# Patient Record
Sex: Female | Born: 1970
Health system: Southern US, Community
[De-identification: ages and names within clinical notes are randomized; demographics above are authoritative.]

## PROBLEM LIST (undated history)

## (undated) DIAGNOSIS — I10 Essential (primary) hypertension: Secondary | ICD-10-CM

## (undated) DIAGNOSIS — R51 Headache: Secondary | ICD-10-CM

## (undated) DIAGNOSIS — M1990 Unspecified osteoarthritis, unspecified site: Secondary | ICD-10-CM

## (undated) DIAGNOSIS — K219 Gastro-esophageal reflux disease without esophagitis: Secondary | ICD-10-CM

## (undated) DIAGNOSIS — F32A Depression, unspecified: Secondary | ICD-10-CM

## (undated) DIAGNOSIS — F419 Anxiety disorder, unspecified: Secondary | ICD-10-CM

## (undated) DIAGNOSIS — G4730 Sleep apnea, unspecified: Secondary | ICD-10-CM

## (undated) DIAGNOSIS — Z1509 Genetic susceptibility to other malignant neoplasm: Secondary | ICD-10-CM

## (undated) DIAGNOSIS — D869 Sarcoidosis, unspecified: Secondary | ICD-10-CM

## (undated) DIAGNOSIS — G709 Myoneural disorder, unspecified: Secondary | ICD-10-CM

## (undated) DIAGNOSIS — C50919 Malignant neoplasm of unspecified site of unspecified female breast: Secondary | ICD-10-CM

## (undated) DIAGNOSIS — R0602 Shortness of breath: Secondary | ICD-10-CM

## (undated) DIAGNOSIS — Z1501 Genetic susceptibility to malignant neoplasm of breast: Secondary | ICD-10-CM

## (undated) DIAGNOSIS — F329 Major depressive disorder, single episode, unspecified: Secondary | ICD-10-CM

## (undated) DIAGNOSIS — Z9109 Other allergy status, other than to drugs and biological substances: Secondary | ICD-10-CM

## (undated) DIAGNOSIS — J189 Pneumonia, unspecified organism: Secondary | ICD-10-CM

## (undated) DIAGNOSIS — J45909 Unspecified asthma, uncomplicated: Secondary | ICD-10-CM

## (undated) HISTORY — DX: Unspecified osteoarthritis, unspecified site: M19.90

## (undated) HISTORY — PX: TUBAL LIGATION: SHX77

## (undated) HISTORY — DX: Major depressive disorder, single episode, unspecified: F32.9

## (undated) HISTORY — PX: BREAST SURGERY: SHX581

## (undated) HISTORY — PX: MASTECTOMY: SHX3

## (undated) HISTORY — DX: Genetic susceptibility to malignant neoplasm of breast: Z15.01

## (undated) HISTORY — PX: ABDOMINAL HYSTERECTOMY: SHX81

## (undated) HISTORY — DX: Depression, unspecified: F32.A

## (undated) HISTORY — DX: Malignant neoplasm of unspecified site of unspecified female breast: C50.919

## (undated) HISTORY — DX: Genetic susceptibility to other malignant neoplasm: Z15.09

## (undated) HISTORY — DX: Anxiety disorder, unspecified: F41.9

---

## 2005-10-15 ENCOUNTER — Emergency Department (HOSPITAL_COMMUNITY): Admission: EM | Admit: 2005-10-15 | Discharge: 2005-10-15 | Payer: Self-pay | Admitting: Emergency Medicine

## 2007-09-19 DIAGNOSIS — C50919 Malignant neoplasm of unspecified site of unspecified female breast: Secondary | ICD-10-CM

## 2007-09-19 HISTORY — PX: OTHER SURGICAL HISTORY: SHX169

## 2007-09-19 HISTORY — DX: Malignant neoplasm of unspecified site of unspecified female breast: C50.919

## 2007-11-28 DIAGNOSIS — C50919 Malignant neoplasm of unspecified site of unspecified female breast: Secondary | ICD-10-CM | POA: Insufficient documentation

## 2007-12-16 ENCOUNTER — Ambulatory Visit: Payer: Self-pay | Admitting: Oncology

## 2007-12-17 ENCOUNTER — Encounter: Admission: RE | Admit: 2007-12-17 | Discharge: 2007-12-17 | Payer: Self-pay | Admitting: General Surgery

## 2008-01-01 ENCOUNTER — Ambulatory Visit (HOSPITAL_COMMUNITY): Admission: RE | Admit: 2008-01-01 | Discharge: 2008-01-01 | Payer: Self-pay | Admitting: Oncology

## 2008-09-15 ENCOUNTER — Ambulatory Visit: Admission: RE | Admit: 2008-09-15 | Discharge: 2008-12-14 | Payer: Self-pay | Admitting: Radiation Oncology

## 2008-09-16 ENCOUNTER — Ambulatory Visit: Payer: Self-pay | Admitting: Oncology

## 2008-09-17 ENCOUNTER — Ambulatory Visit: Admission: RE | Admit: 2008-09-17 | Discharge: 2008-09-17 | Payer: Self-pay | Admitting: Radiation Oncology

## 2008-09-17 ENCOUNTER — Encounter: Payer: Self-pay | Admitting: Radiation Oncology

## 2008-09-18 HISTORY — PX: COLONOSCOPY: SHX174

## 2008-09-30 LAB — CBC & DIFF AND RETIC
BASO%: 0.8 % (ref 0.0–2.0)
Basophils Absolute: 0 10*3/uL (ref 0.0–0.1)
EOS%: 0.8 % (ref 0.0–7.0)
Eosinophils Absolute: 0 10*3/uL (ref 0.0–0.5)
HCT: 32.7 % — ABNORMAL LOW (ref 34.8–46.6)
HGB: 10.4 g/dL — ABNORMAL LOW (ref 11.6–15.9)
IRF: 0.34 — ABNORMAL HIGH (ref 0.130–0.330)
LYMPH%: 30.8 % (ref 14.0–48.0)
MCH: 25.3 pg — ABNORMAL LOW (ref 26.0–34.0)
MCHC: 31.8 g/dL — ABNORMAL LOW (ref 32.0–36.0)
MCV: 79.6 fL — ABNORMAL LOW (ref 81.0–101.0)
MONO#: 0.4 10*3/uL (ref 0.1–0.9)
MONO%: 8 % (ref 0.0–13.0)
NEUT#: 3.3 10*3/uL (ref 1.5–6.5)
NEUT%: 59.6 % (ref 39.6–76.8)
Platelets: 391 10*3/uL (ref 145–400)
RBC: 4.11 10*6/uL (ref 3.70–5.32)
RDW: 18.1 % — ABNORMAL HIGH (ref 11.3–14.5)
RETIC #: 77.3 10*3/uL (ref 19.7–115.1)
Retic %: 1.9 % (ref 0.4–2.3)
WBC: 5.6 10*3/uL (ref 3.9–10.0)
lymph#: 1.7 10*3/uL (ref 0.9–3.3)

## 2008-09-30 LAB — MORPHOLOGY: PLT EST: ADEQUATE

## 2008-09-30 LAB — IRON AND TIBC
%SAT: 44 % (ref 20–55)
Iron: 221 ug/dL — ABNORMAL HIGH (ref 42–145)
TIBC: 503 ug/dL — ABNORMAL HIGH (ref 250–470)
UIBC: 282 ug/dL

## 2008-09-30 LAB — COMPREHENSIVE METABOLIC PANEL
ALT: 12 U/L (ref 0–35)
AST: 14 U/L (ref 0–37)
Albumin: 4.2 g/dL (ref 3.5–5.2)
Alkaline Phosphatase: 59 U/L (ref 39–117)
BUN: 7 mg/dL (ref 6–23)
CO2: 24 mEq/L (ref 19–32)
Calcium: 9.5 mg/dL (ref 8.4–10.5)
Chloride: 104 mEq/L (ref 96–112)
Creatinine, Ser: 0.71 mg/dL (ref 0.40–1.20)
Glucose, Bld: 80 mg/dL (ref 70–99)
Potassium: 3.9 mEq/L (ref 3.5–5.3)
Sodium: 138 mEq/L (ref 135–145)
Total Bilirubin: 0.3 mg/dL (ref 0.3–1.2)
Total Protein: 7.5 g/dL (ref 6.0–8.3)

## 2008-09-30 LAB — CHCC SMEAR

## 2008-09-30 LAB — CANCER ANTIGEN 27.29: CA 27.29: 8 U/mL (ref 0–39)

## 2008-09-30 LAB — LACTATE DEHYDROGENASE: LDH: 135 U/L (ref 94–250)

## 2008-09-30 LAB — FERRITIN: Ferritin: 6 ng/mL — ABNORMAL LOW (ref 10–291)

## 2008-10-02 LAB — CELL SEARCH FOR BREAST CANCER

## 2008-10-27 LAB — CBC WITH DIFFERENTIAL/PLATELET
BASO%: 0.5 % (ref 0.0–2.0)
Basophils Absolute: 0 10*3/uL (ref 0.0–0.1)
EOS%: 1.1 % (ref 0.0–7.0)
Eosinophils Absolute: 0.1 10*3/uL (ref 0.0–0.5)
HCT: 34.7 % — ABNORMAL LOW (ref 34.8–46.6)
HGB: 11.4 g/dL — ABNORMAL LOW (ref 11.6–15.9)
LYMPH%: 13.7 % — ABNORMAL LOW (ref 14.0–48.0)
MCH: 26.3 pg (ref 26.0–34.0)
MCHC: 32.9 g/dL (ref 32.0–36.0)
MCV: 79.9 fL — ABNORMAL LOW (ref 81.0–101.0)
MONO#: 0.6 10*3/uL (ref 0.1–0.9)
MONO%: 10.7 % (ref 0.0–13.0)
NEUT#: 4.2 10*3/uL (ref 1.5–6.5)
NEUT%: 74 % (ref 39.6–76.8)
Platelets: 257 10*3/uL (ref 145–400)
RBC: 4.34 10*6/uL (ref 3.70–5.32)
RDW: 20.1 % — ABNORMAL HIGH (ref 11.3–14.5)
WBC: 5.6 10*3/uL (ref 3.9–10.0)
lymph#: 0.8 10*3/uL — ABNORMAL LOW (ref 0.9–3.3)

## 2008-10-30 ENCOUNTER — Ambulatory Visit: Payer: Self-pay | Admitting: Oncology

## 2008-11-09 ENCOUNTER — Ambulatory Visit: Payer: Self-pay | Admitting: Psychiatry

## 2008-11-17 LAB — CBC WITH DIFFERENTIAL/PLATELET
BASO%: 0.3 % (ref 0.0–2.0)
Basophils Absolute: 0 10*3/uL (ref 0.0–0.1)
EOS%: 1.6 % (ref 0.0–7.0)
Eosinophils Absolute: 0.1 10*3/uL (ref 0.0–0.5)
HCT: 36.6 % (ref 34.8–46.6)
HGB: 12.1 g/dL (ref 11.6–15.9)
LYMPH%: 11.7 % — ABNORMAL LOW (ref 14.0–49.7)
MCH: 26.7 pg (ref 25.1–34.0)
MCHC: 33 g/dL (ref 31.5–36.0)
MCV: 81.1 fL (ref 79.5–101.0)
MONO#: 0.6 10*3/uL (ref 0.1–0.9)
MONO%: 11.5 % (ref 0.0–14.0)
NEUT#: 3.8 10*3/uL (ref 1.5–6.5)
NEUT%: 74.9 % (ref 38.4–76.8)
Platelets: 360 10*3/uL (ref 145–400)
RBC: 4.51 10*6/uL (ref 3.70–5.45)
RDW: 22 % — ABNORMAL HIGH (ref 11.2–14.5)
WBC: 5.1 10*3/uL (ref 3.9–10.3)
lymph#: 0.6 10*3/uL — ABNORMAL LOW (ref 0.9–3.3)

## 2008-11-19 LAB — CBC WITH DIFFERENTIAL/PLATELET
BASO%: 0.3 % (ref 0.0–2.0)
Basophils Absolute: 0 10*3/uL (ref 0.0–0.1)
EOS%: 1.5 % (ref 0.0–7.0)
Eosinophils Absolute: 0.1 10*3/uL (ref 0.0–0.5)
HCT: 35 % (ref 34.8–46.6)
HGB: 11.7 g/dL (ref 11.6–15.9)
LYMPH%: 12.1 % — ABNORMAL LOW (ref 14.0–49.7)
MCH: 26.9 pg (ref 25.1–34.0)
MCHC: 33.4 g/dL (ref 31.5–36.0)
MCV: 80.4 fL (ref 79.5–101.0)
MONO#: 0.6 10*3/uL (ref 0.1–0.9)
MONO%: 13.4 % (ref 0.0–14.0)
NEUT#: 3.3 10*3/uL (ref 1.5–6.5)
NEUT%: 72.7 % (ref 38.4–76.8)
Platelets: 348 10*3/uL (ref 145–400)
RBC: 4.35 10*6/uL (ref 3.70–5.45)
RDW: 22.2 % — ABNORMAL HIGH (ref 11.2–14.5)
WBC: 4.6 10*3/uL (ref 3.9–10.3)
lymph#: 0.6 10*3/uL — ABNORMAL LOW (ref 0.9–3.3)

## 2008-11-19 LAB — COMPREHENSIVE METABOLIC PANEL
ALT: 17 U/L (ref 0–35)
AST: 15 U/L (ref 0–37)
Albumin: 4 g/dL (ref 3.5–5.2)
Alkaline Phosphatase: 52 U/L (ref 39–117)
BUN: 6 mg/dL (ref 6–23)
CO2: 23 mEq/L (ref 19–32)
Calcium: 9.3 mg/dL (ref 8.4–10.5)
Chloride: 105 mEq/L (ref 96–112)
Creatinine, Ser: 0.61 mg/dL (ref 0.40–1.20)
Glucose, Bld: 94 mg/dL (ref 70–99)
Potassium: 4.1 mEq/L (ref 3.5–5.3)
Sodium: 138 mEq/L (ref 135–145)
Total Bilirubin: 0.2 mg/dL — ABNORMAL LOW (ref 0.3–1.2)
Total Protein: 7.4 g/dL (ref 6.0–8.3)

## 2008-11-23 ENCOUNTER — Ambulatory Visit: Payer: Self-pay | Admitting: Psychiatry

## 2008-11-24 LAB — CBC WITH DIFFERENTIAL/PLATELET
BASO%: 0.2 % (ref 0.0–2.0)
Basophils Absolute: 0 10*3/uL (ref 0.0–0.1)
EOS%: 0.9 % (ref 0.0–7.0)
Eosinophils Absolute: 0 10*3/uL (ref 0.0–0.5)
HCT: 33.7 % — ABNORMAL LOW (ref 34.8–46.6)
HGB: 11.2 g/dL — ABNORMAL LOW (ref 11.6–15.9)
LYMPH%: 10.7 % — ABNORMAL LOW (ref 14.0–49.7)
MCH: 26.8 pg (ref 25.1–34.0)
MCHC: 33.3 g/dL (ref 31.5–36.0)
MCV: 80.7 fL (ref 79.5–101.0)
MONO#: 0.7 10*3/uL (ref 0.1–0.9)
MONO%: 12.5 % (ref 0.0–14.0)
NEUT#: 4.1 10*3/uL (ref 1.5–6.5)
NEUT%: 75.7 % (ref 38.4–76.8)
Platelets: 326 10*3/uL (ref 145–400)
RBC: 4.18 10*6/uL (ref 3.70–5.45)
RDW: 22.4 % — ABNORMAL HIGH (ref 11.2–14.5)
WBC: 5.4 10*3/uL (ref 3.9–10.3)
lymph#: 0.6 10*3/uL — ABNORMAL LOW (ref 0.9–3.3)

## 2008-11-24 LAB — COMPREHENSIVE METABOLIC PANEL
ALT: 14 U/L (ref 0–35)
AST: 14 U/L (ref 0–37)
Albumin: 3.8 g/dL (ref 3.5–5.2)
Alkaline Phosphatase: 55 U/L (ref 39–117)
BUN: 7 mg/dL (ref 6–23)
CO2: 23 mEq/L (ref 19–32)
Calcium: 9.5 mg/dL (ref 8.4–10.5)
Chloride: 104 mEq/L (ref 96–112)
Creatinine, Ser: 0.65 mg/dL (ref 0.40–1.20)
Glucose, Bld: 103 mg/dL — ABNORMAL HIGH (ref 70–99)
Potassium: 4.2 mEq/L (ref 3.5–5.3)
Sodium: 138 mEq/L (ref 135–145)
Total Bilirubin: 0.2 mg/dL — ABNORMAL LOW (ref 0.3–1.2)
Total Protein: 6.9 g/dL (ref 6.0–8.3)

## 2008-11-27 ENCOUNTER — Encounter: Admission: RE | Admit: 2008-11-27 | Discharge: 2008-12-16 | Payer: Self-pay | Admitting: Radiation Oncology

## 2008-11-28 ENCOUNTER — Ambulatory Visit (HOSPITAL_COMMUNITY): Admission: RE | Admit: 2008-11-28 | Discharge: 2008-11-28 | Payer: Self-pay | Admitting: Oncology

## 2008-12-08 LAB — CBC WITH DIFFERENTIAL/PLATELET
BASO%: 0.2 % (ref 0.0–2.0)
Basophils Absolute: 0 10*3/uL (ref 0.0–0.1)
EOS%: 1.6 % (ref 0.0–7.0)
Eosinophils Absolute: 0.1 10*3/uL (ref 0.0–0.5)
HCT: 29.9 % — ABNORMAL LOW (ref 34.8–46.6)
HGB: 10.2 g/dL — ABNORMAL LOW (ref 11.6–15.9)
LYMPH%: 18.9 % (ref 14.0–49.7)
MCH: 27.6 pg (ref 25.1–34.0)
MCHC: 34.2 g/dL (ref 31.5–36.0)
MCV: 80.8 fL (ref 79.5–101.0)
MONO#: 0.4 10*3/uL (ref 0.1–0.9)
MONO%: 9.6 % (ref 0.0–14.0)
NEUT#: 3 10*3/uL (ref 1.5–6.5)
NEUT%: 69.7 % (ref 38.4–76.8)
Platelets: 281 10*3/uL (ref 145–400)
RBC: 3.7 10*6/uL (ref 3.70–5.45)
RDW: 23.2 % — ABNORMAL HIGH (ref 11.2–14.5)
WBC: 4.3 10*3/uL (ref 3.9–10.3)
lymph#: 0.8 10*3/uL — ABNORMAL LOW (ref 0.9–3.3)

## 2008-12-08 LAB — COMPREHENSIVE METABOLIC PANEL
ALT: 20 U/L (ref 0–35)
AST: 19 U/L (ref 0–37)
Albumin: 3.8 g/dL (ref 3.5–5.2)
Alkaline Phosphatase: 52 U/L (ref 39–117)
BUN: 7 mg/dL (ref 6–23)
CO2: 22 mEq/L (ref 19–32)
Calcium: 9.4 mg/dL (ref 8.4–10.5)
Chloride: 105 mEq/L (ref 96–112)
Creatinine, Ser: 0.68 mg/dL (ref 0.40–1.20)
Glucose, Bld: 107 mg/dL — ABNORMAL HIGH (ref 70–99)
Potassium: 4 mEq/L (ref 3.5–5.3)
Sodium: 137 mEq/L (ref 135–145)
Total Bilirubin: 0.2 mg/dL — ABNORMAL LOW (ref 0.3–1.2)
Total Protein: 6.9 g/dL (ref 6.0–8.3)

## 2008-12-09 ENCOUNTER — Ambulatory Visit: Payer: Self-pay | Admitting: Psychiatry

## 2008-12-17 ENCOUNTER — Ambulatory Visit: Payer: Self-pay | Admitting: Oncology

## 2008-12-17 ENCOUNTER — Ambulatory Visit: Payer: Self-pay | Admitting: Psychiatry

## 2008-12-17 ENCOUNTER — Encounter (HOSPITAL_COMMUNITY): Admission: RE | Admit: 2008-12-17 | Discharge: 2009-01-21 | Payer: Self-pay | Admitting: Oncology

## 2008-12-17 LAB — CBC WITH DIFFERENTIAL/PLATELET
BASO%: 0.5 % (ref 0.0–2.0)
Basophils Absolute: 0 10*3/uL (ref 0.0–0.1)
EOS%: 1.2 % (ref 0.0–7.0)
Eosinophils Absolute: 0.1 10*3/uL (ref 0.0–0.5)
HCT: 25.5 % — ABNORMAL LOW (ref 34.8–46.6)
HGB: 8.6 g/dL — ABNORMAL LOW (ref 11.6–15.9)
LYMPH%: 20.3 % (ref 14.0–49.7)
MCH: 28.4 pg (ref 25.1–34.0)
MCHC: 33.9 g/dL (ref 31.5–36.0)
MCV: 83.6 fL (ref 79.5–101.0)
MONO#: 0.6 10*3/uL (ref 0.1–0.9)
MONO%: 12.3 % (ref 0.0–14.0)
NEUT#: 3 10*3/uL (ref 1.5–6.5)
NEUT%: 65.7 % (ref 38.4–76.8)
Platelets: 361 10*3/uL (ref 145–400)
RBC: 3.05 10*6/uL — ABNORMAL LOW (ref 3.70–5.45)
RDW: 24 % — ABNORMAL HIGH (ref 11.2–14.5)
WBC: 4.5 10*3/uL (ref 3.9–10.3)
lymph#: 0.9 10*3/uL (ref 0.9–3.3)

## 2008-12-17 LAB — HOLD TUBE, BLOOD BANK

## 2008-12-17 LAB — TYPE & CROSSMATCH - CHCC

## 2008-12-24 ENCOUNTER — Ambulatory Visit: Payer: Self-pay | Admitting: Psychiatry

## 2009-01-07 LAB — COMPREHENSIVE METABOLIC PANEL
ALT: 16 U/L (ref 0–35)
AST: 15 U/L (ref 0–37)
Albumin: 3.8 g/dL (ref 3.5–5.2)
Alkaline Phosphatase: 63 U/L (ref 39–117)
BUN: 8 mg/dL (ref 6–23)
CO2: 22 mEq/L (ref 19–32)
Calcium: 9 mg/dL (ref 8.4–10.5)
Chloride: 107 mEq/L (ref 96–112)
Creatinine, Ser: 0.67 mg/dL (ref 0.40–1.20)
Glucose, Bld: 115 mg/dL — ABNORMAL HIGH (ref 70–99)
Potassium: 4.1 mEq/L (ref 3.5–5.3)
Sodium: 139 mEq/L (ref 135–145)
Total Bilirubin: 0.2 mg/dL — ABNORMAL LOW (ref 0.3–1.2)
Total Protein: 6.9 g/dL (ref 6.0–8.3)

## 2009-01-07 LAB — CBC WITH DIFFERENTIAL/PLATELET
BASO%: 0.6 % (ref 0.0–2.0)
Basophils Absolute: 0 10*3/uL (ref 0.0–0.1)
EOS%: 3.6 % (ref 0.0–7.0)
Eosinophils Absolute: 0.2 10*3/uL (ref 0.0–0.5)
HCT: 31 % — ABNORMAL LOW (ref 34.8–46.6)
HGB: 10.3 g/dL — ABNORMAL LOW (ref 11.6–15.9)
LYMPH%: 17.5 % (ref 14.0–49.7)
MCH: 28.1 pg (ref 25.1–34.0)
MCHC: 33.1 g/dL (ref 31.5–36.0)
MCV: 85.1 fL (ref 79.5–101.0)
MONO#: 0.4 10*3/uL (ref 0.1–0.9)
MONO%: 6.8 % (ref 0.0–14.0)
NEUT#: 4.2 10*3/uL (ref 1.5–6.5)
NEUT%: 71.5 % (ref 38.4–76.8)
Platelets: 347 10*3/uL (ref 145–400)
RBC: 3.65 10*6/uL — ABNORMAL LOW (ref 3.70–5.45)
RDW: 16.7 % — ABNORMAL HIGH (ref 11.2–14.5)
WBC: 5.9 10*3/uL (ref 3.9–10.3)
lymph#: 1 10*3/uL (ref 0.9–3.3)

## 2009-01-07 LAB — CANCER ANTIGEN 27.29: CA 27.29: 8 U/mL (ref 0–39)

## 2009-01-26 ENCOUNTER — Ambulatory Visit: Payer: Self-pay | Admitting: Psychiatry

## 2009-02-03 ENCOUNTER — Ambulatory Visit: Payer: Self-pay | Admitting: Psychiatry

## 2009-03-03 ENCOUNTER — Ambulatory Visit: Payer: Self-pay | Admitting: Oncology

## 2009-03-03 ENCOUNTER — Ambulatory Visit: Payer: Self-pay | Admitting: Psychiatry

## 2009-03-10 ENCOUNTER — Emergency Department (HOSPITAL_COMMUNITY): Admission: EM | Admit: 2009-03-10 | Discharge: 2009-03-10 | Payer: Self-pay | Admitting: Emergency Medicine

## 2009-03-30 ENCOUNTER — Ambulatory Visit: Payer: Self-pay | Admitting: Psychiatry

## 2009-04-01 LAB — CBC WITH DIFFERENTIAL/PLATELET
BASO%: 0.4 % (ref 0.0–2.0)
Basophils Absolute: 0 10*3/uL (ref 0.0–0.1)
EOS%: 2.3 % (ref 0.0–7.0)
Eosinophils Absolute: 0.1 10*3/uL (ref 0.0–0.5)
HCT: 37.6 % (ref 34.8–46.6)
HGB: 12.5 g/dL (ref 11.6–15.9)
LYMPH%: 28 % (ref 14.0–49.7)
MCH: 27.2 pg (ref 25.1–34.0)
MCHC: 33.2 g/dL (ref 31.5–36.0)
MCV: 81.9 fL (ref 79.5–101.0)
MONO#: 0.5 10*3/uL (ref 0.1–0.9)
MONO%: 9.2 % (ref 0.0–14.0)
NEUT#: 3.2 10*3/uL (ref 1.5–6.5)
NEUT%: 60.1 % (ref 38.4–76.8)
Platelets: 300 10*3/uL (ref 145–400)
RBC: 4.59 10*6/uL (ref 3.70–5.45)
RDW: 16 % — ABNORMAL HIGH (ref 11.2–14.5)
WBC: 5.4 10*3/uL (ref 3.9–10.3)
lymph#: 1.5 10*3/uL (ref 0.9–3.3)

## 2009-04-01 LAB — COMPREHENSIVE METABOLIC PANEL
ALT: 23 U/L (ref 0–35)
AST: 18 U/L (ref 0–37)
Albumin: 4 g/dL (ref 3.5–5.2)
Alkaline Phosphatase: 59 U/L (ref 39–117)
BUN: 8 mg/dL (ref 6–23)
CO2: 25 mEq/L (ref 19–32)
Calcium: 10 mg/dL (ref 8.4–10.5)
Chloride: 105 mEq/L (ref 96–112)
Creatinine, Ser: 0.69 mg/dL (ref 0.40–1.20)
Glucose, Bld: 135 mg/dL — ABNORMAL HIGH (ref 70–99)
Potassium: 4.3 mEq/L (ref 3.5–5.3)
Sodium: 140 mEq/L (ref 135–145)
Total Bilirubin: 0.3 mg/dL (ref 0.3–1.2)
Total Protein: 7.1 g/dL (ref 6.0–8.3)

## 2009-04-01 LAB — CANCER ANTIGEN 27.29: CA 27.29: 15 U/mL (ref 0–39)

## 2009-04-01 LAB — VITAMIN D 25 HYDROXY (VIT D DEFICIENCY, FRACTURES): Vit D, 25-Hydroxy: 31 ng/mL (ref 30–89)

## 2009-04-01 LAB — LACTATE DEHYDROGENASE: LDH: 147 U/L (ref 94–250)

## 2009-04-06 ENCOUNTER — Ambulatory Visit: Payer: Self-pay | Admitting: Oncology

## 2009-05-17 ENCOUNTER — Ambulatory Visit: Payer: Self-pay | Admitting: Psychiatry

## 2009-07-15 ENCOUNTER — Ambulatory Visit: Payer: Self-pay | Admitting: Oncology

## 2009-07-27 LAB — CBC WITH DIFFERENTIAL/PLATELET
BASO%: 0.9 % (ref 0.0–2.0)
Basophils Absolute: 0.1 10*3/uL (ref 0.0–0.1)
EOS%: 7.1 % — ABNORMAL HIGH (ref 0.0–7.0)
Eosinophils Absolute: 0.7 10*3/uL — ABNORMAL HIGH (ref 0.0–0.5)
HCT: 29.6 % — ABNORMAL LOW (ref 34.8–46.6)
HGB: 9.4 g/dL — ABNORMAL LOW (ref 11.6–15.9)
LYMPH%: 23 % (ref 14.0–49.7)
MCH: 27 pg (ref 25.1–34.0)
MCHC: 31.8 g/dL (ref 31.5–36.0)
MCV: 85.1 fL (ref 79.5–101.0)
MONO#: 0.6 10*3/uL (ref 0.1–0.9)
MONO%: 5.4 % (ref 0.0–14.0)
NEUT#: 6.6 10*3/uL — ABNORMAL HIGH (ref 1.5–6.5)
NEUT%: 63.6 % (ref 38.4–76.8)
Platelets: 584 10*3/uL — ABNORMAL HIGH (ref 145–400)
RBC: 3.48 10*6/uL — ABNORMAL LOW (ref 3.70–5.45)
RDW: 14.5 % (ref 11.2–14.5)
WBC: 10.3 10*3/uL (ref 3.9–10.3)
lymph#: 2.4 10*3/uL (ref 0.9–3.3)
nRBC: 0 % (ref 0–0)

## 2009-07-27 LAB — COMPREHENSIVE METABOLIC PANEL
ALT: 39 U/L — ABNORMAL HIGH (ref 0–35)
AST: 20 U/L (ref 0–37)
Albumin: 3.8 g/dL (ref 3.5–5.2)
Alkaline Phosphatase: 117 U/L (ref 39–117)
BUN: 8 mg/dL (ref 6–23)
CO2: 27 mEq/L (ref 19–32)
Calcium: 9.8 mg/dL (ref 8.4–10.5)
Chloride: 103 mEq/L (ref 96–112)
Creatinine, Ser: 0.68 mg/dL (ref 0.40–1.20)
Glucose, Bld: 89 mg/dL (ref 70–99)
Potassium: 4.3 mEq/L (ref 3.5–5.3)
Sodium: 140 mEq/L (ref 135–145)
Total Bilirubin: 0.3 mg/dL (ref 0.3–1.2)
Total Protein: 6.9 g/dL (ref 6.0–8.3)

## 2009-07-27 LAB — VITAMIN D 25 HYDROXY (VIT D DEFICIENCY, FRACTURES): Vit D, 25-Hydroxy: 31 ng/mL (ref 30–89)

## 2009-07-27 LAB — CANCER ANTIGEN 27.29: CA 27.29: 8 U/mL (ref 0–39)

## 2009-07-27 LAB — LACTATE DEHYDROGENASE: LDH: 181 U/L (ref 94–250)

## 2009-09-18 HISTORY — PX: RECONSTRUCTION BREAST W/ TRAM FLAP: SUR1079

## 2009-11-30 ENCOUNTER — Ambulatory Visit: Payer: Self-pay | Admitting: Oncology

## 2010-03-03 ENCOUNTER — Ambulatory Visit (HOSPITAL_COMMUNITY): Admission: RE | Admit: 2010-03-03 | Discharge: 2010-03-03 | Payer: Self-pay | Admitting: Internal Medicine

## 2010-10-09 ENCOUNTER — Encounter: Payer: Self-pay | Admitting: Oncology

## 2010-12-28 LAB — CROSSMATCH
ABO/RH(D): A POS
Antibody Screen: NEGATIVE

## 2010-12-28 LAB — ABO/RH: ABO/RH(D): A POS

## 2012-04-26 LAB — PROTIME-INR

## 2013-01-10 ENCOUNTER — Telehealth: Payer: Self-pay | Admitting: *Deleted

## 2013-01-10 NOTE — Telephone Encounter (Signed)
Pt called stating that she has moved back to the area and is having some pain in her breast.  She was told that Dr. Donnie Coffin was no longer here, so she requested to see another MD.  I confirmed 01/27/13 appt w/ pt.  Mailed before appt letter & packet to pt.  I also mailed a medical release form for the pt to fill out and mail back so I can get records from [facility] office.  Took paperwork to Med Rec for chart.

## 2013-01-23 ENCOUNTER — Encounter: Payer: Self-pay | Admitting: *Deleted

## 2013-01-23 NOTE — Progress Notes (Signed)
Received medical release form from pt.  Faxed to [facility] medical records department for records.

## 2013-01-24 ENCOUNTER — Other Ambulatory Visit: Payer: Self-pay | Admitting: Medical Oncology

## 2013-01-24 DIAGNOSIS — C50919 Malignant neoplasm of unspecified site of unspecified female breast: Secondary | ICD-10-CM

## 2013-01-27 ENCOUNTER — Ambulatory Visit: Payer: Federal, State, Local not specified - PPO

## 2013-01-27 ENCOUNTER — Telehealth: Payer: Self-pay | Admitting: *Deleted

## 2013-01-27 ENCOUNTER — Ambulatory Visit (HOSPITAL_BASED_OUTPATIENT_CLINIC_OR_DEPARTMENT_OTHER): Payer: Federal, State, Local not specified - PPO | Admitting: Oncology

## 2013-01-27 ENCOUNTER — Encounter: Payer: Self-pay | Admitting: Oncology

## 2013-01-27 ENCOUNTER — Other Ambulatory Visit: Payer: Self-pay | Admitting: *Deleted

## 2013-01-27 ENCOUNTER — Other Ambulatory Visit (HOSPITAL_BASED_OUTPATIENT_CLINIC_OR_DEPARTMENT_OTHER): Payer: Federal, State, Local not specified - PPO | Admitting: Lab

## 2013-01-27 VITALS — BP 131/84 | HR 78 | Temp 98.3°F | Resp 20 | Ht 68.0 in | Wt 239.7 lb

## 2013-01-27 DIAGNOSIS — Z1509 Genetic susceptibility to other malignant neoplasm: Secondary | ICD-10-CM

## 2013-01-27 DIAGNOSIS — F411 Generalized anxiety disorder: Secondary | ICD-10-CM

## 2013-01-27 DIAGNOSIS — C50919 Malignant neoplasm of unspecified site of unspecified female breast: Secondary | ICD-10-CM

## 2013-01-27 DIAGNOSIS — C50912 Malignant neoplasm of unspecified site of left female breast: Secondary | ICD-10-CM

## 2013-01-27 DIAGNOSIS — F4310 Post-traumatic stress disorder, unspecified: Secondary | ICD-10-CM

## 2013-01-27 DIAGNOSIS — G894 Chronic pain syndrome: Secondary | ICD-10-CM

## 2013-01-27 DIAGNOSIS — Z1501 Genetic susceptibility to malignant neoplasm of breast: Secondary | ICD-10-CM | POA: Insufficient documentation

## 2013-01-27 HISTORY — DX: Genetic susceptibility to malignant neoplasm of breast: Z15.01

## 2013-01-27 LAB — COMPREHENSIVE METABOLIC PANEL (CC13)
ALT: 18 U/L (ref 0–55)
AST: 15 U/L (ref 5–34)
Albumin: 3.9 g/dL (ref 3.5–5.0)
Alkaline Phosphatase: 63 U/L (ref 40–150)
BUN: 10 mg/dL (ref 7.0–26.0)
CO2: 26 mEq/L (ref 22–29)
Calcium: 9.5 mg/dL (ref 8.4–10.4)
Chloride: 105 mEq/L (ref 98–107)
Creatinine: 0.7 mg/dL (ref 0.6–1.1)
Glucose: 95 mg/dl (ref 70–99)
Potassium: 3.7 mEq/L (ref 3.5–5.1)
Sodium: 140 mEq/L (ref 136–145)
Total Bilirubin: 0.45 mg/dL (ref 0.20–1.20)
Total Protein: 7.8 g/dL (ref 6.4–8.3)

## 2013-01-27 LAB — CBC WITH DIFFERENTIAL/PLATELET
BASO%: 0.6 % (ref 0.0–2.0)
Basophils Absolute: 0 10*3/uL (ref 0.0–0.1)
EOS%: 1 % (ref 0.0–7.0)
Eosinophils Absolute: 0.1 10*3/uL (ref 0.0–0.5)
HCT: 36.5 % (ref 34.8–46.6)
HGB: 12.5 g/dL (ref 11.6–15.9)
LYMPH%: 54.8 % — ABNORMAL HIGH (ref 14.0–49.7)
MCH: 28.8 pg (ref 25.1–34.0)
MCHC: 34.2 g/dL (ref 31.5–36.0)
MCV: 84.1 fL (ref 79.5–101.0)
MONO#: 0.4 10*3/uL (ref 0.1–0.9)
MONO%: 6.3 % (ref 0.0–14.0)
NEUT#: 2.5 10*3/uL (ref 1.5–6.5)
NEUT%: 37.3 % — ABNORMAL LOW (ref 38.4–76.8)
Platelets: 272 10*3/uL (ref 145–400)
RBC: 4.34 10*6/uL (ref 3.70–5.45)
RDW: 13.5 % (ref 11.2–14.5)
WBC: 6.7 10*3/uL (ref 3.9–10.3)
lymph#: 3.7 10*3/uL — ABNORMAL HIGH (ref 0.9–3.3)

## 2013-01-27 NOTE — Telephone Encounter (Signed)
appts made and printed. Pt is aware that Olegario Messier will call w/ appt for her MRI, and cs will call w/ appt for her PET.Marland KitchenMarland KitchenTD

## 2013-01-27 NOTE — Patient Instructions (Addendum)
PET scan for staging purposes  MRI/Mammogram of breasts  I will see yo back in 1-2 months

## 2013-01-27 NOTE — Progress Notes (Signed)
Checked in new pt with financial concerns.  She is working and has ins but is currently living in a shelter.  I gave her an EPP application and will process it when returned with the needed info.

## 2013-01-27 NOTE — Progress Notes (Signed)
Tonae Livolsi [id number] 04/20/1971 42 y.o. 01/27/2013 2:45 PM  CC  Doreatha Martin, MD [address] Internal Medicine [place] [address]  REASON FOR CONSULTATION:  42 year old female with history of triple negative stage III invasive ductal carcinoma of the left breast in the setting of BRCA1 genetic mutation carrier. Patient is seen in medical oncology for reestablishing her oncologic care. She is moving from [facility].   STAGE:  Diagnosis 2009 T3 N1, left breast ER negative PR negative HER-2/neu negative Status post bilateral mastectomies with TRAM flap reconstruction  REFERRING PHYSICIAN: Doreatha Martin  HISTORY OF PRESENT ILLNESS:  Carine Nordgren is a 42 y.o. female.  Who at the age of 52 self palpated a breast mass on the left breast. She had a mammogram on 12/05/2007 that was at normal and she was also noted to have abnormal left axillary lymph node and a large spiculated mass with multiple associated microcalcifications in the left upper outer quadrant. Breast ultrasound showed the mass measuring 2.8 x 2.8 cm with well-circumscribed margins 2 cm from the nipple. Additional 1.2 cm hypoechoic mass was noted at the 5:00 position 1 cm from the nipple with mild lobulated margins. On 12/17/2007 she had MRI which revealed 5.5 x 2.4 cm abnormality in the left upper quadrant. The spiculated mass more superiorly a 2.4 cm mass was also noted she had multiple abnormally enlarged lymph nodes. Biopsy of the lymph node was positive for metastatic ductal carcinoma. Biopsy of the left breast mass was invasive ductal carcinoma ER negative PR negative HER-2/neu negative high grade. She was referred to medical oncology at [facility] and she underwent neoadjuvant Adriamycin and Cytoxan x4 cycles followed by Taxol which she completed in September 2009. She then went on to have bilateral mastectomies performed in [place]. Patient also had BRCA testing since she had a cousin who tested  positive for BRCA1 mutation. Patient herself was also positive for the same BRCA mutation that her cousin tested for her.  Post mastectomy patient received radiation therapy with concurrent so low dose she finished 12/03/2008.Marland Kitchen Then in October 2010 patient underwent TRAM flap reconstruction with Dr. Leona Carry at [facility] in October 2010. Patient subsequently relocated to [place]. She's been followed there by her oncologist every 6 months. She is now transferring her care back to [facility] as her job has relocated her here.  Patient does have a history of chronic pain depression anxiety. She is of that and she does go to the [facility]. She also has history of PTSD.  Past Medical History: Past Medical History  Diagnosis Date  . Breast cancer 2009    triple negative left breast  . Anxiety   . Depression   . Arthritis   . Breast cancer 11/28/2007    Left breast stage T3N1, triple negative  . BRCA1 genetic carrier 01/27/2013    Past Surgical History: Past Surgical History  Procedure Laterality Date  . Bilateral mastectomy Bilateral 2009    left sided breast cancer and prophylactic right  . Abdominal hysterectomy      Family History: Family History  Problem Relation Age of Onset  . Diabetes Mother   . Hypertension Mother   . Hypertension Father   . Diabetes Father   . Hypertension Sister   . Cancer Paternal Aunt 41    breast cancer died at 54  . Cancer Paternal Uncle     lung cancer - 3 uncles  . Diabetes Paternal Grandmother   . Cancer Paternal Grandfather  lung cancer  . Cancer Cousin 30    breast cancer deceased at 78    Social History History  Substance Use Topics  . Smoking status: Never Smoker   . Smokeless tobacco: Never Used  . Alcohol Use: 0.6 oz/week    1 Glasses of wine per week    Allergies: Allergies  Allergen Reactions  . Adhesive (Tape)     Current Medications: Current Outpatient Prescriptions  Medication Sig Dispense  Refill  . Cholecalciferol (VITAMIN D-3 PO) Take by mouth.      . DULoxetine (CYMBALTA) 60 MG capsule Take 60 mg by mouth daily.      Marland Kitchen Fexofenadine HCl (ALLEGRA PO) Take by mouth.      Marland Kitchen ibuprofen (ADVIL,MOTRIN) 800 MG tablet Take 800 mg by mouth every 8 (eight) hours as needed for pain.      . mometasone (NASONEX) 50 MCG/ACT nasal spray Place 2 sprays into the nose daily.      . Multiple Vitamins-Minerals (MULTIVITAMIN PO) Take by mouth.       No current facility-administered medications for this visit.    OB/GYN History: menarche at 24, surgical menopause in 2010, G2P2 first live birth at 31  Fertility Discussion: N/A Prior History of Cancer: as noted above  Health Maintenance:  Colonoscopy yes 2010 Bone Density 2012 Last PAP smear June 2010  ECOG PERFORMANCE STATUS: 1 - Symptomatic but completely ambulatory  Genetic Counseling/testing: tested for BRCA1 and is positive  REVIEW OF SYSTEMS:  Patient has a history of chronic pain specifically in her back she tells me that she had a back injury. She also complains of having left-sided chest pain she feels that it may be her breast. Her last breast MRI was over a year ago when she has been getting MRIs on a yearly basis. She is also having significant anxiety. She has no nausea no vomiting. She does complain of some abdominal pain off and on. She also complains of leg pain bilaterally again this is all chronic. She denies any fevers chills or night sweats she does have headaches off-and-on and some dizziness. Remainder of the 14 point review of systems is negative.  PHYSICAL EXAMINATION: Blood pressure 131/84, pulse 78, temperature 98.3 F (36.8 C), temperature source Oral, resp. rate 20, height 5\' 8"  (1.727 m), weight 239 lb 11.2 oz (108.727 kg). Well-developed well-nourished female in no acute distress HEENT exam EOMI PERRLA sclerae anicteric no conjunctival pallor oral mucosa is moist neck is supple lungs are clear bilaterally to  auscultation cardiovascular is regular rate rhythm no murmurs gallops or rubs abdomen is soft nontender nondistended bowel sounds are present no HSM extremities no edema neuro patient's alert oriented strength is symmetrical in upper and lower extremities Bilateral breast examination: There are no skin changes well healed surgical scar no palpable masses.     STUDIES/RESULTS: No results found.   LABS:    Chemistry      Component Value Date/Time   NA 140 01/27/2013 1333   NA 140 07/27/2009 1134   K 3.7 01/27/2013 1333   K 4.3 07/27/2009 1134   CL 105 01/27/2013 1333   CL 103 07/27/2009 1134   CO2 26 01/27/2013 1333   CO2 27 07/27/2009 1134   BUN 10.0 01/27/2013 1333   BUN 8 07/27/2009 1134   CREATININE 0.7 01/27/2013 1333   CREATININE 0.68 07/27/2009 1134      Component Value Date/Time   CALCIUM 9.5 01/27/2013 1333   CALCIUM 9.8 07/27/2009 1134  ALKPHOS 63 01/27/2013 1333   ALKPHOS 117 07/27/2009 1134   AST 15 01/27/2013 1333   AST 20 07/27/2009 1134   ALT 18 01/27/2013 1333   ALT 39* 07/27/2009 1134   BILITOT 0.45 01/27/2013 1333   BILITOT 0.3 07/27/2009 1134      Lab Results  Component Value Date   WBC 6.7 01/27/2013   HGB 12.5 01/27/2013   HCT 36.5 01/27/2013   MCV 84.1 01/27/2013   PLT 272 01/27/2013     ASSESSMENT    42 year old female with  #1 BRCA1 associated triple negative invasive ductal carcinoma originally diagnosed in 2009. She is status post neoadjuvant chemotherapy consisting of Adriamycin Cytoxan x4 cycles followed by Taxol. She then underwent bilateral mastectomies with a left modified radical mastectomy with axillary dissection and a right simple mastectomy. Final pathology showed a T3 N1 tumor. She had bilateral subpectoral implants placed and then went on to receive radiation therapy with concurrent Xeloda on 12/03/2008. She then underwent bilateral TRAM flap reconstruction with Dr. Leona Carry at [facility] in October 2010.  #2 chronic pain syndrome  #3 PTSD  #4  anxiety     PLAN:    #1 patient and I discussed survivorship today. She still needs to be seen every 6 months. She will also continue to get MRIs of the bilateral breasts performed on a yearly basis.  #2 we also did staging studies since she has been having increasing pain question is whether she may have a recurrence and we will proceed with doing a PET scan.  #3 patient will continue to take her Cymbalta for her aches and pains and followup with her primary care physician.  #4 I will see her back in one to 2 months time for followup.     Thank you so much for allowing me to participate in the care of Ariana Juul. I will continue to follow up the patient with you and assist in her care.  All questions were answered. The patient knows to call the clinic with any problems, questions or concerns. We can certainly see the patient much sooner if necessary.  I spent 55 minutes counseling the patient face to face. The total time spent in the appointment was 60 minutes.   Drue Second, MD Medical/Oncology [facility] [phone] (beeper) [phone] (Office)  01/27/2013, 2:45 PM

## 2013-02-24 ENCOUNTER — Encounter (HOSPITAL_COMMUNITY)
Admission: RE | Admit: 2013-02-24 | Discharge: 2013-02-24 | Disposition: A | Discharge status: Home / Self Care | Payer: Federal, State, Local not specified - PPO | Source: Ambulatory Visit | Attending: Oncology | Admitting: Oncology

## 2013-02-24 ENCOUNTER — Encounter (HOSPITAL_COMMUNITY): Payer: Self-pay

## 2013-02-24 DIAGNOSIS — Z1501 Genetic susceptibility to malignant neoplasm of breast: Secondary | ICD-10-CM | POA: Insufficient documentation

## 2013-02-24 DIAGNOSIS — C50912 Malignant neoplasm of unspecified site of left female breast: Secondary | ICD-10-CM

## 2013-02-24 DIAGNOSIS — C50919 Malignant neoplasm of unspecified site of unspecified female breast: Secondary | ICD-10-CM | POA: Insufficient documentation

## 2013-02-24 LAB — GLUCOSE, CAPILLARY: Glucose-Capillary: 106 mg/dL — ABNORMAL HIGH (ref 70–99)

## 2013-02-24 MED ORDER — FLUDEOXYGLUCOSE F - 18 (FDG) INJECTION
18.0000 | Freq: Once | INTRAVENOUS | Status: AC | PRN
  Administered 2013-02-24: 18 via INTRAVENOUS

## 2013-02-26 NOTE — Progress Notes (Signed)
Quick Note:  Please call patient: PET looks good ______

## 2013-03-25 ENCOUNTER — Other Ambulatory Visit (HOSPITAL_BASED_OUTPATIENT_CLINIC_OR_DEPARTMENT_OTHER): Payer: Federal, State, Local not specified - PPO | Admitting: Lab

## 2013-03-25 ENCOUNTER — Other Ambulatory Visit (HOSPITAL_COMMUNITY): Payer: Self-pay | Admitting: Orthopaedic Surgery

## 2013-03-25 ENCOUNTER — Encounter: Payer: Self-pay | Admitting: Oncology

## 2013-03-25 ENCOUNTER — Other Ambulatory Visit: Payer: Self-pay | Admitting: Oncology

## 2013-03-25 ENCOUNTER — Ambulatory Visit (HOSPITAL_BASED_OUTPATIENT_CLINIC_OR_DEPARTMENT_OTHER): Payer: Federal, State, Local not specified - PPO | Admitting: Oncology

## 2013-03-25 ENCOUNTER — Telehealth: Payer: Self-pay | Admitting: *Deleted

## 2013-03-25 VITALS — BP 132/86 | HR 74 | Temp 98.4°F | Resp 20 | Ht 68.0 in | Wt 244.0 lb

## 2013-03-25 DIAGNOSIS — Z1501 Genetic susceptibility to malignant neoplasm of breast: Secondary | ICD-10-CM

## 2013-03-25 DIAGNOSIS — C50912 Malignant neoplasm of unspecified site of left female breast: Secondary | ICD-10-CM

## 2013-03-25 DIAGNOSIS — C50919 Malignant neoplasm of unspecified site of unspecified female breast: Secondary | ICD-10-CM

## 2013-03-25 LAB — CBC WITH DIFFERENTIAL/PLATELET
BASO%: 0.8 % (ref 0.0–2.0)
Basophils Absolute: 0.1 10*3/uL (ref 0.0–0.1)
EOS%: 1.5 % (ref 0.0–7.0)
Eosinophils Absolute: 0.1 10*3/uL (ref 0.0–0.5)
HCT: 36.1 % (ref 34.8–46.6)
HGB: 12.2 g/dL (ref 11.6–15.9)
LYMPH%: 45.7 % (ref 14.0–49.7)
MCH: 28.6 pg (ref 25.1–34.0)
MCHC: 33.8 g/dL (ref 31.5–36.0)
MCV: 84.5 fL (ref 79.5–101.0)
MONO#: 0.5 10*3/uL (ref 0.1–0.9)
MONO%: 8 % (ref 0.0–14.0)
NEUT#: 2.9 10*3/uL (ref 1.5–6.5)
NEUT%: 44 % (ref 38.4–76.8)
Platelets: 272 10*3/uL (ref 145–400)
RBC: 4.27 10*6/uL (ref 3.70–5.45)
RDW: 13.9 % (ref 11.2–14.5)
WBC: 6.7 10*3/uL (ref 3.9–10.3)
lymph#: 3.1 10*3/uL (ref 0.9–3.3)

## 2013-03-25 LAB — COMPREHENSIVE METABOLIC PANEL (CC13)
ALT: 23 U/L (ref 0–55)
AST: 16 U/L (ref 5–34)
Albumin: 3.6 g/dL (ref 3.5–5.0)
Alkaline Phosphatase: 57 U/L (ref 40–150)
BUN: 7.3 mg/dL (ref 7.0–26.0)
CO2: 25 mEq/L (ref 22–29)
Calcium: 9.6 mg/dL (ref 8.4–10.4)
Chloride: 108 mEq/L (ref 98–109)
Creatinine: 0.7 mg/dL (ref 0.6–1.1)
Glucose: 127 mg/dl (ref 70–140)
Potassium: 4 mEq/L (ref 3.5–5.1)
Sodium: 141 mEq/L (ref 136–145)
Total Bilirubin: 0.27 mg/dL (ref 0.20–1.20)
Total Protein: 7.4 g/dL (ref 6.4–8.3)

## 2013-03-25 NOTE — Patient Instructions (Addendum)
#  1 we discussed your PET scan results is no evidence of metastatic disease.  #2 you will need MRI of the breasts in January 2014.  #3 I will plan on seeing you back after the MRIs are performed in January 2015  #4 continue to exercise and eat healthy and try weight loss

## 2013-03-25 NOTE — Progress Notes (Signed)
OFFICE PROGRESS NOTE  CC  VELAZQUEZ,GRETCHEN, MD [address] Internal Medicine [place] [address]  DIAGNOSIS: 42 year old female with history of triple negative stage III invasive ductal carcinoma of the left breast in the setting of BRCA1 genetic mutation carrier. Patient is seen in medical oncology for reestablishing her oncologic care. She is moving from [facility].   STAGE:  Diagnosis 2009  T3 N1, left breast  ER negative PR negative HER-2/neu negative  Status post bilateral mastectomies with TRAM flap reconstruction   PRIOR THERAPY: #1at the age of 39 self palpated a breast mass on the left breast. She had a mammogram on 12/05/2007 that was at normal and she was also noted to have abnormal left axillary lymph node and a large spiculated mass with multiple associated microcalcifications in the left upper outer quadrant. Breast ultrasound showed the mass measuring 2.8 x 2.8 cm with well-circumscribed margins 2 cm from the nipple. Additional 1.2 cm hypoechoic mass was noted at the 5:00 position 1 cm from the nipple with mild lobulated margins. On 12/17/2007 she had MRI which revealed 5.5 x 2.4 cm abnormality in the left upper quadrant. The spiculated mass more superiorly a 2.4 cm mass was also noted she had multiple abnormally enlarged lymph nodes  #2Biopsy of the lymph node was positive for metastatic ductal carcinoma. Biopsy of the left breast mass was invasive ductal carcinoma ER negative PR negative HER-2/neu negative high grade. She was referred to medical oncology at [facility] and she underwent neoadjuvant Adriamycin and Cytoxan x4 cycles followed by Taxol which she completed in September 2009. She then went on to have bilateral mastectomies performed in [place]. Patient also had BRCA testing since she had a cousin who tested positive for BRCA1 mutation. Patient herself was also positive for the same BRCA mutation that her cousin tested for her.  #3 patient has now  been on observation. She has no evidence of recurrent disease. She had a PET scan performed on 02/24/2013 this does not reveal any evidence of metastatic disease.   CURRENT THERAPY: Observation  INTERVAL HISTORY: Suheyla Mortellaro 41 y.o. female returns for followup visit. Overall she is doing well she is without any significant complaints except for fatigue and back pain. Apparently she injured her back at work she is waiting to have some surgery performed. She is also seeing a psychiatrist for her ongoing psychosocial issues and posttraumatic stress disorder. She had been on Cymbalta but now has been changed to Prozac as well as trazodone. But in spite of that she is still quite anxious. She has not noticed any masses in her reconstructive breasts. She has no fevers chills night sweats headaches shortness of breath chest pains palpitations. Patient does have carpal tunnel of the right hand. Remainder of the 10 point review of systems is negative.  MEDICAL HISTORY: Past Medical History  Diagnosis Date  . Breast cancer 2009    triple negative left breast  . Anxiety   . Depression   . Arthritis   . Breast cancer 11/28/2007    Left breast stage T3N1, triple negative  . BRCA1 genetic carrier 01/27/2013    ALLERGIES:  is allergic to adhesive.  MEDICATIONS:  Current Outpatient Prescriptions  Medication Sig Dispense Refill  . Cholecalciferol (VITAMIN D-3 PO) Take by mouth.      . Fexofenadine HCl (ALLEGRA PO) Take by mouth.      Marland Kitchen FLUoxetine (PROZAC) 20 MG capsule Take 20 mg by mouth daily.      Marland Kitchen  mometasone (NASONEX) 50 MCG/ACT nasal spray Place 2 sprays into the nose daily.      . Multiple Vitamins-Minerals (MULTIVITAMIN PO) Take by mouth.      . naproxen (NAPROSYN) 500 MG tablet Take 500 mg by mouth 2 (two) times daily with a meal.      . SYMBICORT 160-4.5 MCG/ACT inhaler       . traZODone (DESYREL) 50 MG tablet Take 50 mg by mouth at bedtime.      Marland Kitchen ibuprofen (ADVIL,MOTRIN) 800 MG tablet  Take 800 mg by mouth every 8 (eight) hours as needed for pain.       No current facility-administered medications for this visit.    SURGICAL HISTORY:  Past Surgical History  Procedure Laterality Date  . Bilateral mastectomy Bilateral 2009    left sided breast cancer and prophylactic right  . Abdominal hysterectomy      REVIEW OF SYSTEMS:  Pertinent items are noted in HPI.   HEALTH MAINTENANCE:  PHYSICAL EXAMINATION: Blood pressure 132/86, pulse 74, temperature 98.4 F (36.9 C), temperature source Oral, resp. rate 20, height 5\' 8"  (1.727 m), weight 244 lb (110.678 kg). Body mass index is 37.11 kg/(m^2). ECOG PERFORMANCE STATUS: 0 - Asymptomatic   General appearance: alert, cooperative, appears stated age, fatigued and slowed mentation Lymph nodes: Cervical, supraclavicular, and axillary nodes normal. Resp: clear to auscultation bilaterally Back: symmetric, no curvature. ROM normal. No CVA tenderness. Cardio: regular rate and rhythm, S1, S2 normal, no murmur, click, rub or gallop GI: soft, non-tender; bowel sounds normal; no masses,  no organomegaly Extremities: extremities normal, atraumatic, no cyanosis or edema Neurologic: Grossly normal Bilateral breast examination well-healed surgical scar no evidence of local recurrence the masses.  LABORATORY DATA: Lab Results  Component Value Date   WBC 6.7 03/25/2013   HGB 12.2 03/25/2013   HCT 36.1 03/25/2013   MCV 84.5 03/25/2013   PLT 272 03/25/2013      Chemistry      Component Value Date/Time   NA 140 01/27/2013 1333   NA 140 07/27/2009 1134   K 3.7 01/27/2013 1333   K 4.3 07/27/2009 1134   CL 105 01/27/2013 1333   CL 103 07/27/2009 1134   CO2 26 01/27/2013 1333   CO2 27 07/27/2009 1134   BUN 10.0 01/27/2013 1333   BUN 8 07/27/2009 1134   CREATININE 0.7 01/27/2013 1333   CREATININE 0.68 07/27/2009 1134      Component Value Date/Time   CALCIUM 9.5 01/27/2013 1333   CALCIUM 9.8 07/27/2009 1134   ALKPHOS 63 01/27/2013 1333   ALKPHOS 117  07/27/2009 1134   AST 15 01/27/2013 1333   AST 20 07/27/2009 1134   ALT 18 01/27/2013 1333   ALT 39* 07/27/2009 1134   BILITOT 0.45 01/27/2013 1333   BILITOT 0.3 07/27/2009 1134       RADIOGRAPHIC STUDIES:  Nm Pet Image Restag (ps) Skull Base To Thigh  02/24/2013   *RADIOLOGY REPORT*  Clinical Data: Subsequent treatment strategy for breast cancer. Triple negative high risk breast cancer.  Initial diagnosis in 2009 with T3 N1 left breast carcinoma status post TRAM flap reconstruction.  NUCLEAR MEDICINE PET SKULL BASE TO THIGH  Fasting Blood Glucose:  106  Technique:  18.0 mCi F-18 FDG was injected intravenously. CT data was obtained and used for attenuation correction and anatomic localization only.  (This was not acquired as a diagnostic CT examination.) Additional exam technical data entered on technologist worksheet.  Comparison:  Bone scan 03/03/2010  Findings:  Neck: There is hypermetabolic activity within the left mandible just posterior to the molars which is felt to be odontogenic in origin.  Chest:  There is mild metabolic activity associated with a right axillary lymph node ( SUV max = 2.1).  This lymph node has normal morphology in the activity is felt to be reactive in nature.  There are no suspicious pulmonary nodules.  Left TRAM flap reconstruction noted.  Abdomen/Pelvis:  No abnormal hypermetabolic activity within the liver, pancreas, adrenal glands, or spleen.  No hypermetabolic lymph nodes in the abdomen or pelvis.  Skeleton:  No focal hypermetabolic activity to suggest skeletal metastasis.  IMPRESSION:  1.  No evidence of breast cancer recurrence or metastasis. 2.  Mild uptake within a right axillary node is most likely reactive. 3.  Uptake within the right mandible is felt to be odontogenic in nature.   Original Report Authenticated By: Genevive Bi, M.D.    ASSESSMENT: 42 year old female with  #1 BRCA1 associated triple negative invasive ductal carcinoma originally diagnosed in 2009.  She is status post neoadjuvant chemotherapy consisting of Adriamycin Cytoxan x4 cycles followed by Taxol. She then underwent bilateral mastectomies with a left modified radical mastectomy with axillary dissection and a right simple mastectomy. Final pathology showed a T3 N1 tumor. She had bilateral subpectoral implants placed and then went on to receive radiation therapy with concurrent Xeloda on 12/03/2008. She then underwent bilateral TRAM flap reconstruction with Dr. Leona Carry at [facility] in October 2010.   #2 chronic pain syndrome   #3 PTSD   #4 anxiety    PLAN:   #1 patient will continue to see me in about January 2015.  #2 she will have a mammogram and MRI performed on a yearly basis.  #3 she is encouraged to join the finding in the normal classes.  #4 were discussed exercise eating healthy and weight loss.   All questions were answered. The patient knows to call the clinic with any problems, questions or concerns. We can certainly see the patient much sooner if necessary.  I spent 25 minutes counseling the patient face to face. The total time spent in the appointment was 30 minutes.    Drue Second, MD Medical/Oncology [facility] [phone] (beeper) [phone] (Office)  03/25/2013, 9:30 AM

## 2013-03-25 NOTE — Telephone Encounter (Signed)
appts made and printed. Pt sw Cynthia Saunders on my desk phone and her MRI of the breast will be scheduled for 09/2013. Cynthia Saunders made the pt aware that she will need to get her MRI of the breast released from [place]. I printed the release form out for her off the [facility] web....td

## 2013-04-07 ENCOUNTER — Encounter (HOSPITAL_COMMUNITY): Payer: Self-pay | Admitting: Pharmacy Technician

## 2013-04-10 ENCOUNTER — Encounter (HOSPITAL_COMMUNITY): Payer: Self-pay

## 2013-04-10 ENCOUNTER — Encounter (HOSPITAL_COMMUNITY)
Admission: RE | Admit: 2013-04-10 | Discharge: 2013-04-10 | Disposition: A | Discharge status: Home / Self Care | Payer: Federal, State, Local not specified - PPO | Source: Ambulatory Visit | Attending: Orthopaedic Surgery | Admitting: Orthopaedic Surgery

## 2013-04-10 ENCOUNTER — Encounter (HOSPITAL_COMMUNITY)
Admission: RE | Admit: 2013-04-10 | Discharge: 2013-04-10 | Disposition: A | Discharge status: Home / Self Care | Source: Ambulatory Visit | Attending: Orthopaedic Surgery | Admitting: Orthopaedic Surgery

## 2013-04-10 DIAGNOSIS — Z853 Personal history of malignant neoplasm of breast: Secondary | ICD-10-CM | POA: Insufficient documentation

## 2013-04-10 DIAGNOSIS — Z01818 Encounter for other preprocedural examination: Secondary | ICD-10-CM | POA: Insufficient documentation

## 2013-04-10 DIAGNOSIS — M5126 Other intervertebral disc displacement, lumbar region: Secondary | ICD-10-CM | POA: Insufficient documentation

## 2013-04-10 DIAGNOSIS — K219 Gastro-esophageal reflux disease without esophagitis: Secondary | ICD-10-CM | POA: Insufficient documentation

## 2013-04-10 DIAGNOSIS — D869 Sarcoidosis, unspecified: Secondary | ICD-10-CM | POA: Insufficient documentation

## 2013-04-10 DIAGNOSIS — J45909 Unspecified asthma, uncomplicated: Secondary | ICD-10-CM | POA: Insufficient documentation

## 2013-04-10 DIAGNOSIS — I10 Essential (primary) hypertension: Secondary | ICD-10-CM | POA: Insufficient documentation

## 2013-04-10 DIAGNOSIS — R0602 Shortness of breath: Secondary | ICD-10-CM | POA: Insufficient documentation

## 2013-04-10 DIAGNOSIS — Z01810 Encounter for preprocedural cardiovascular examination: Secondary | ICD-10-CM | POA: Insufficient documentation

## 2013-04-10 DIAGNOSIS — Z01812 Encounter for preprocedural laboratory examination: Secondary | ICD-10-CM | POA: Insufficient documentation

## 2013-04-10 HISTORY — DX: Unspecified asthma, uncomplicated: J45.909

## 2013-04-10 HISTORY — DX: Essential (primary) hypertension: I10

## 2013-04-10 HISTORY — DX: Sleep apnea, unspecified: G47.30

## 2013-04-10 HISTORY — DX: Other allergy status, other than to drugs and biological substances: Z91.09

## 2013-04-10 HISTORY — DX: Gastro-esophageal reflux disease without esophagitis: K21.9

## 2013-04-10 HISTORY — DX: Myoneural disorder, unspecified: G70.9

## 2013-04-10 HISTORY — DX: Pneumonia, unspecified organism: J18.9

## 2013-04-10 HISTORY — DX: Sarcoidosis, unspecified: D86.9

## 2013-04-10 HISTORY — DX: Headache: R51

## 2013-04-10 HISTORY — DX: Shortness of breath: R06.02

## 2013-04-10 LAB — CBC
HCT: 39.1 % (ref 36.0–46.0)
Hemoglobin: 13.1 g/dL (ref 12.0–15.0)
MCH: 28.2 pg (ref 26.0–34.0)
MCHC: 33.5 g/dL (ref 30.0–36.0)
MCV: 84.1 fL (ref 78.0–100.0)
Platelets: 288 10*3/uL (ref 150–400)
RBC: 4.65 MIL/uL (ref 3.87–5.11)
RDW: 13.7 % (ref 11.5–15.5)
WBC: 8.5 10*3/uL (ref 4.0–10.5)

## 2013-04-10 LAB — SURGICAL PCR SCREEN
MRSA, PCR: NEGATIVE
Staphylococcus aureus: NEGATIVE

## 2013-04-10 LAB — COMPREHENSIVE METABOLIC PANEL
ALT: 22 U/L (ref 0–35)
AST: 19 U/L (ref 0–37)
Albumin: 3.9 g/dL (ref 3.5–5.2)
Alkaline Phosphatase: 81 U/L (ref 39–117)
BUN: 6 mg/dL (ref 6–23)
CO2: 24 mEq/L (ref 19–32)
Calcium: 10.1 mg/dL (ref 8.4–10.5)
Chloride: 101 mEq/L (ref 96–112)
Creatinine, Ser: 0.64 mg/dL (ref 0.50–1.10)
GFR calc Af Amer: >90 mL/min (ref >90)
GFR calc non Af Amer: >90 mL/min (ref >90)
Glucose, Bld: 142 mg/dL — ABNORMAL HIGH (ref 70–99)
Potassium: 4 mEq/L (ref 3.5–5.1)
Sodium: 137 mEq/L (ref 135–145)
Total Bilirubin: 0.4 mg/dL (ref 0.3–1.2)
Total Protein: 8 g/dL (ref 6.0–8.3)

## 2013-04-10 LAB — PROTIME-INR
INR: 1 (ref 0.00–1.49)
Prothrombin Time: 13 seconds (ref 11.6–15.2)

## 2013-04-10 LAB — HCG, SERUM, QUALITATIVE: Preg, Serum: NEGATIVE

## 2013-04-10 NOTE — Pre-Procedure Instructions (Signed)
Cynthia Saunders  04/10/2013   Your procedure is scheduled on:  Fri, Aug 1 @ 7:30 AM  Report to [facility] at 5:30 AM.  Call this number if you have problems the morning of surgery: [phone]   Remember:   Do not eat food or drink liquids after midnight.   Take these medicines the morning of surgery with A SIP OF WATER: Albuterol<Bring Your Inhaler With You>,QVAR,Allegra(Fexofenadine),Prozac(Fluoxetine),Nasonex(Mometasone),and Symbicort              Stop taking your Naprosyn and Fish Oil.No Goody's,BC's,Ibuprofen,Aspirin,or any Herbal Medications   Do not wear jewelry, make-up or nail polish.  Do not wear lotions, powders, or perfumes. You may wear deodorant.  Do not shave 48 hours prior to surgery.   Do not bring valuables to the hospital.  [facility] is not responsible                   for any belongings or valuables.  Contacts, dentures or bridgework may not be worn into surgery.  Leave suitcase in the car. After surgery it may be brought to your room.  For patients admitted to the hospital, checkout time is 11:00 AM the day of  discharge.     Special Instructions: Shower using CHG 2 nights before surgery and the night before surgery.  If you shower the day of surgery use CHG.  Use special wash - you have one bottle of CHG for all showers.  You should use approximately 1/3 of the bottle for each shower.   Please read over the following fact sheets that you were given: Pain Booklet, Coughing and Deep Breathing, MRSA Information and Surgical Site Infection Prevention

## 2013-04-14 ENCOUNTER — Encounter: Payer: Self-pay | Admitting: *Deleted

## 2013-04-14 NOTE — Progress Notes (Signed)
Mailed after appt letter to pt. 

## 2013-04-16 NOTE — H&P (Signed)
Cynthia Saunders is an 42 y.o. female.   Chief Complaint: right leg pain and numbness. HPI: Pt with pain in her back and persistent weakness and numbness of right leg.  She has had MRI findings of HNP right L4-5 with compression and stenosis.  She has failed conservative treatment with ESIs, physical therapy and activity modification.  Discography has shown moderate right paracentral disc herniation at L4-5.   Pt now wishes to proceed with surgical intervention.  This injury is a [employer] investment [employer] and she has been previously scheduled for surgery but recently moved to [place] and wishes to proceed under the care of Dr Ophelia Charter.  Past Medical History  Diagnosis Date  . Breast cancer 2009    triple negative left breast  . Anxiety   . Depression   . Arthritis   . Breast cancer 11/28/2007    Left breast stage T3N1, triple negative  . BRCA1 genetic carrier 01/27/2013  . Hypertension     during pregnancy 2002  . Sarcoidosis   . Shortness of breath   . Asthma   . Pollen allergies   . Pneumonia     2010, post surgery  . Sleep apnea   . GERD (gastroesophageal reflux disease)     watches what she eats  . Headache(784.0)     thinks due to prozac  . Neuromuscular disorder     neuropathy legs started after chemo    Past Surgical History  Procedure Laterality Date  . Bilateral mastectomy Bilateral 2009    left sided breast cancer and prophylactic right  . Abdominal hysterectomy    . Reconstruction breast w/ tram flap Bilateral 2011    [facility]  . Breast surgery    . Cesarean section      x 2  . Mastectomy    . Tubal ligation    . Colonoscopy  2010    Family History  Problem Relation Age of Onset  . Diabetes Mother   . Hypertension Mother   . Hypertension Father   . Diabetes Father   . Hypertension Sister   . Cancer Paternal Aunt 37    breast cancer died at 57  . Cancer Paternal Uncle     lung cancer - 3 uncles  . Diabetes Paternal Grandmother   . Cancer  Paternal Grandfather     lung cancer  . Cancer Cousin 30    breast cancer deceased at 78   Social History:  reports that she has never smoked. She has never used smokeless tobacco. She reports that she drinks about 0.6 ounces of alcohol per week. She reports that she does not use illicit drugs.  Allergies:  Allergies  Allergen Reactions  . Adhesive (Tape)     No prescriptions prior to admission    No results found for this or any previous visit (from the past 48 hour(s)). No results found.  Review of Systems  Musculoskeletal: Positive for back pain and joint pain.       History of ankle sprain and tarsal tunnel syndrome. Previous injury to right shoulder.  Neurological: Positive for tingling and focal weakness.       Right leg radiculopathy  All other systems reviewed and are negative.    There were no vitals taken for this visit. Physical Exam  Constitutional: She is oriented to person, place, and time. She appears well-developed and well-nourished.  HENT:  Head: Normocephalic and atraumatic.  Eyes: EOM are normal. Pupils are equal, round, and  reactive to light.  Neck: Normal range of motion.  Cardiovascular: Normal rate.   Respiratory: Effort normal.  GI: Soft.  Musculoskeletal:  + SLR on right .  Weakness of right EHL and anterior tib.  No other focal weakness in either LE  Neurological: She is alert and oriented to person, place, and time.  Skin: Skin is warm and dry.     Assessment/Plan HNP right L4-5  PLAN:  Right L4-5 microdiscectomy  Cynthia Saunders 04/16/2013, 2:56 PM

## 2013-04-16 NOTE — H&P (Signed)
Pt. With OTJI with failed conservative Tx positive discogram , MRI and more than  12 months of radicular leg pain.

## 2013-04-17 MED ORDER — CEFAZOLIN SODIUM-DEXTROSE 2-3 GM-% IV SOLR
2.0000 g | INTRAVENOUS | Status: AC
  Administered 2013-04-18: 2 g via INTRAVENOUS
  Filled 2013-04-17: qty 50

## 2013-04-18 ENCOUNTER — Observation Stay (HOSPITAL_COMMUNITY)

## 2013-04-18 ENCOUNTER — Encounter (HOSPITAL_COMMUNITY): Payer: Self-pay | Admitting: *Deleted

## 2013-04-18 ENCOUNTER — Ambulatory Visit (HOSPITAL_COMMUNITY): Admitting: Anesthesiology

## 2013-04-18 ENCOUNTER — Encounter (HOSPITAL_COMMUNITY)
Admission: RE | Disposition: A | Discharge status: Home / Self Care | Payer: Self-pay | Source: Ambulatory Visit | Attending: Orthopaedic Surgery

## 2013-04-18 ENCOUNTER — Observation Stay (HOSPITAL_COMMUNITY)
Admission: RE | Admit: 2013-04-18 | Discharge: 2013-04-20 | Disposition: A | Discharge status: Home / Self Care | Source: Ambulatory Visit | Attending: Orthopaedic Surgery | Admitting: Orthopaedic Surgery

## 2013-04-18 ENCOUNTER — Encounter (HOSPITAL_COMMUNITY): Payer: Self-pay | Admitting: Anesthesiology

## 2013-04-18 DIAGNOSIS — Z853 Personal history of malignant neoplasm of breast: Secondary | ICD-10-CM | POA: Diagnosis not present

## 2013-04-18 DIAGNOSIS — X58XXXA Exposure to other specified factors, initial encounter: Secondary | ICD-10-CM | POA: Insufficient documentation

## 2013-04-18 DIAGNOSIS — I10 Essential (primary) hypertension: Secondary | ICD-10-CM | POA: Insufficient documentation

## 2013-04-18 DIAGNOSIS — M5126 Other intervertebral disc displacement, lumbar region: Secondary | ICD-10-CM | POA: Diagnosis not present

## 2013-04-18 DIAGNOSIS — Y990 Civilian activity done for income or pay: Secondary | ICD-10-CM | POA: Insufficient documentation

## 2013-04-18 DIAGNOSIS — M5137 Other intervertebral disc degeneration, lumbosacral region: Secondary | ICD-10-CM | POA: Diagnosis not present

## 2013-04-18 DIAGNOSIS — M51379 Other intervertebral disc degeneration, lumbosacral region without mention of lumbar back pain or lower extremity pain: Secondary | ICD-10-CM | POA: Insufficient documentation

## 2013-04-18 HISTORY — PX: LUMBAR LAMINECTOMY: SHX95

## 2013-04-18 SURGERY — MICRODISCECTOMY LUMBAR LAMINECTOMY
Anesthesia: General | Site: Back | Laterality: Right | Wound class: Clean

## 2013-04-18 MED ORDER — KETOROLAC TROMETHAMINE 30 MG/ML IJ SOLN
INTRAMUSCULAR | Status: AC
  Filled 2013-04-18: qty 1

## 2013-04-18 MED ORDER — PHENYLEPHRINE HCL 10 MG/ML IJ SOLN
INTRAMUSCULAR | Status: DC | PRN
  Administered 2013-04-18 (×3): 80 ug via INTRAVENOUS

## 2013-04-18 MED ORDER — FLUOXETINE HCL 20 MG PO CAPS
20.0000 mg | ORAL_CAPSULE | Freq: Every day | ORAL | Status: DC
  Filled 2013-04-18: qty 1

## 2013-04-18 MED ORDER — KCL IN DEXTROSE-NACL 20-5-0.45 MEQ/L-%-% IV SOLN
INTRAVENOUS | Status: DC
  Administered 2013-04-18: 75 mL via INTRAVENOUS
  Filled 2013-04-18 (×5): qty 1000

## 2013-04-18 MED ORDER — SENNOSIDES-DOCUSATE SODIUM 8.6-50 MG PO TABS: 1.0000 | ORAL_TABLET | Freq: Every evening | ORAL | Status: DC | PRN

## 2013-04-18 MED ORDER — OXYCODONE HCL 5 MG PO TABS: 5.0000 mg | ORAL_TABLET | Freq: Once | ORAL | Status: DC | PRN

## 2013-04-18 MED ORDER — ALBUTEROL SULFATE HFA 108 (90 BASE) MCG/ACT IN AERS: 2.0000 | INHALATION_SPRAY | Freq: Four times a day (QID) | RESPIRATORY_TRACT | Status: DC | PRN

## 2013-04-18 MED ORDER — 0.9 % SODIUM CHLORIDE (POUR BTL) OPTIME
TOPICAL | Status: DC | PRN
  Administered 2013-04-18: 1000 mL

## 2013-04-18 MED ORDER — OXYCODONE-ACETAMINOPHEN 5-325 MG PO TABS: 1.0000 | ORAL_TABLET | ORAL | Status: DC | PRN

## 2013-04-18 MED ORDER — SODIUM CHLORIDE 0.9 % IJ SOLN: 3.0000 mL | INTRAMUSCULAR | Status: DC | PRN

## 2013-04-18 MED ORDER — METHOCARBAMOL 500 MG PO TABS: 500.0000 mg | ORAL_TABLET | Freq: Four times a day (QID) | ORAL | Status: DC | PRN

## 2013-04-18 MED ORDER — KETOROLAC TROMETHAMINE 30 MG/ML IJ SOLN
30.0000 mg | Freq: Four times a day (QID) | INTRAMUSCULAR | Status: AC
  Administered 2013-04-18 – 2013-04-19 (×4): 30 mg via INTRAVENOUS
  Filled 2013-04-18 (×2): qty 1

## 2013-04-18 MED ORDER — GLYCOPYRROLATE 0.2 MG/ML IJ SOLN
INTRAMUSCULAR | Status: DC | PRN
  Administered 2013-04-18: 0.6 mg via INTRAVENOUS

## 2013-04-18 MED ORDER — ACETAMINOPHEN 650 MG RE SUPP: 650.0000 mg | RECTAL | Status: DC | PRN

## 2013-04-18 MED ORDER — ACETAMINOPHEN 325 MG PO TABS: 650.0000 mg | ORAL_TABLET | ORAL | Status: DC | PRN

## 2013-04-18 MED ORDER — OXYCODONE HCL 5 MG/5ML PO SOLN: 5.0000 mg | Freq: Once | ORAL | Status: DC | PRN

## 2013-04-18 MED ORDER — MIDAZOLAM HCL 5 MG/5ML IJ SOLN
INTRAMUSCULAR | Status: DC | PRN
  Administered 2013-04-18: 2 mg via INTRAVENOUS

## 2013-04-18 MED ORDER — LIDOCAINE HCL 4 % MT SOLN
OROMUCOSAL | Status: DC | PRN
  Administered 2013-04-18: 4 mL via TOPICAL

## 2013-04-18 MED ORDER — PROPOFOL 10 MG/ML IV BOLUS
INTRAVENOUS | Status: DC | PRN
  Administered 2013-04-18: 200 mg via INTRAVENOUS

## 2013-04-18 MED ORDER — PROMETHAZINE HCL 25 MG/ML IJ SOLN: 6.2500 mg | INTRAMUSCULAR | Status: DC | PRN

## 2013-04-18 MED ORDER — DOCUSATE SODIUM 100 MG PO CAPS
100.0000 mg | ORAL_CAPSULE | Freq: Two times a day (BID) | ORAL | Status: DC
  Administered 2013-04-18 – 2013-04-19 (×4): 100 mg via ORAL
  Filled 2013-04-18 (×5): qty 1

## 2013-04-18 MED ORDER — FLUTICASONE PROPIONATE HFA 44 MCG/ACT IN AERO
1.0000 | INHALATION_SPRAY | Freq: Two times a day (BID) | RESPIRATORY_TRACT | Status: DC
  Administered 2013-04-18 – 2013-04-19 (×3): 1 via RESPIRATORY_TRACT
  Filled 2013-04-18: qty 10.6

## 2013-04-18 MED ORDER — MENTHOL 3 MG MT LOZG: 1.0000 | LOZENGE | OROMUCOSAL | Status: DC | PRN

## 2013-04-18 MED ORDER — METHOCARBAMOL 100 MG/ML IJ SOLN
500.0000 mg | Freq: Four times a day (QID) | INTRAVENOUS | Status: DC | PRN
  Filled 2013-04-18: qty 5

## 2013-04-18 MED ORDER — ONDANSETRON HCL 4 MG/2ML IJ SOLN
INTRAMUSCULAR | Status: DC | PRN
  Administered 2013-04-18: 4 mg via INTRAVENOUS

## 2013-04-18 MED ORDER — SODIUM CHLORIDE 0.9 % IV SOLN: 250.0000 mL | INTRAVENOUS | Status: DC

## 2013-04-18 MED ORDER — ONDANSETRON HCL 4 MG/2ML IJ SOLN: 4.0000 mg | INTRAMUSCULAR | Status: DC | PRN

## 2013-04-18 MED ORDER — BUPIVACAINE HCL (PF) 0.25 % IJ SOLN
INTRAMUSCULAR | Status: AC
  Filled 2013-04-18: qty 30

## 2013-04-18 MED ORDER — LIDOCAINE HCL (CARDIAC) 20 MG/ML IV SOLN
INTRAVENOUS | Status: DC | PRN
  Administered 2013-04-18: 75 mg via INTRAVENOUS

## 2013-04-18 MED ORDER — PANTOPRAZOLE SODIUM 40 MG IV SOLR
40.0000 mg | Freq: Every day | INTRAVENOUS | Status: DC
  Administered 2013-04-18 – 2013-04-19 (×2): 40 mg via INTRAVENOUS
  Filled 2013-04-18 (×3): qty 40

## 2013-04-18 MED ORDER — OXYCODONE-ACETAMINOPHEN 5-325 MG PO TABS
1.0000 | ORAL_TABLET | ORAL | Status: DC | PRN
  Administered 2013-04-18 – 2013-04-20 (×7): 2 via ORAL
  Filled 2013-04-18 (×7): qty 2

## 2013-04-18 MED ORDER — FENTANYL CITRATE 0.05 MG/ML IJ SOLN
INTRAMUSCULAR | Status: DC | PRN
  Administered 2013-04-18: 50 ug via INTRAVENOUS
  Administered 2013-04-18: 100 ug via INTRAVENOUS
  Administered 2013-04-18: 50 ug via INTRAVENOUS

## 2013-04-18 MED ORDER — LORATADINE 10 MG PO TABS
10.0000 mg | ORAL_TABLET | Freq: Every day | ORAL | Status: DC
  Administered 2013-04-18 – 2013-04-19 (×2): 10 mg via ORAL
  Filled 2013-04-18 (×3): qty 1

## 2013-04-18 MED ORDER — ZOLPIDEM TARTRATE 5 MG PO TABS: 5.0000 mg | ORAL_TABLET | Freq: Every evening | ORAL | Status: DC | PRN

## 2013-04-18 MED ORDER — FLUTICASONE PROPIONATE 50 MCG/ACT NA SUSP
2.0000 | Freq: Every day | NASAL | Status: DC
  Administered 2013-04-18 – 2013-04-19 (×2): 2 via NASAL
  Filled 2013-04-18: qty 16

## 2013-04-18 MED ORDER — HYDROMORPHONE HCL PF 1 MG/ML IJ SOLN
INTRAMUSCULAR | Status: AC
  Filled 2013-04-18: qty 1

## 2013-04-18 MED ORDER — FLEET ENEMA 7-19 GM/118ML RE ENEM: 1.0000 | ENEMA | Freq: Once | RECTAL | Status: AC | PRN

## 2013-04-18 MED ORDER — NEOSTIGMINE METHYLSULFATE 1 MG/ML IJ SOLN
INTRAMUSCULAR | Status: DC | PRN
  Administered 2013-04-18: 4 mg via INTRAVENOUS

## 2013-04-18 MED ORDER — HYDROCODONE-ACETAMINOPHEN 5-325 MG PO TABS
1.0000 | ORAL_TABLET | ORAL | Status: DC | PRN
  Filled 2013-04-18: qty 2

## 2013-04-18 MED ORDER — SODIUM CHLORIDE 0.9 % IJ SOLN
3.0000 mL | Freq: Two times a day (BID) | INTRAMUSCULAR | Status: DC
  Administered 2013-04-19: 3 mL via INTRAVENOUS

## 2013-04-18 MED ORDER — METHOCARBAMOL 500 MG PO TABS
500.0000 mg | ORAL_TABLET | Freq: Four times a day (QID) | ORAL | Status: DC | PRN
  Administered 2013-04-19 – 2013-04-20 (×4): 500 mg via ORAL
  Filled 2013-04-18 (×4): qty 1

## 2013-04-18 MED ORDER — CEFAZOLIN SODIUM 1-5 GM-% IV SOLN
1.0000 g | Freq: Three times a day (TID) | INTRAVENOUS | Status: AC
  Administered 2013-04-18 – 2013-04-19 (×2): 1 g via INTRAVENOUS
  Filled 2013-04-18 (×3): qty 50

## 2013-04-18 MED ORDER — MORPHINE SULFATE 2 MG/ML IJ SOLN: 1.0000 mg | INTRAMUSCULAR | Status: DC | PRN

## 2013-04-18 MED ORDER — HYDROMORPHONE HCL PF 1 MG/ML IJ SOLN
0.2500 mg | INTRAMUSCULAR | Status: DC | PRN
  Administered 2013-04-18 (×2): 0.5 mg via INTRAVENOUS
  Administered 2013-04-18: 0.25 mg via INTRAVENOUS
  Administered 2013-04-18: 0.5 mg via INTRAVENOUS
  Administered 2013-04-18: 0.25 mg via INTRAVENOUS

## 2013-04-18 MED ORDER — BISACODYL 10 MG RE SUPP: 10.0000 mg | Freq: Every day | RECTAL | Status: DC | PRN

## 2013-04-18 MED ORDER — BUPIVACAINE HCL (PF) 0.25 % IJ SOLN
INTRAMUSCULAR | Status: DC | PRN
  Administered 2013-04-18: 10 mL

## 2013-04-18 MED ORDER — ROCURONIUM BROMIDE 100 MG/10ML IV SOLN
INTRAVENOUS | Status: DC | PRN
  Administered 2013-04-18: 50 mg via INTRAVENOUS

## 2013-04-18 MED ORDER — PHENOL 1.4 % MT LIQD
1.0000 | OROMUCOSAL | Status: DC | PRN
  Filled 2013-04-18: qty 177

## 2013-04-18 MED ORDER — BUDESONIDE-FORMOTEROL FUMARATE 160-4.5 MCG/ACT IN AERO
2.0000 | INHALATION_SPRAY | Freq: Two times a day (BID) | RESPIRATORY_TRACT | Status: DC
  Administered 2013-04-18 – 2013-04-19 (×3): 2 via RESPIRATORY_TRACT
  Filled 2013-04-18: qty 6

## 2013-04-18 MED ORDER — TRAZODONE HCL 50 MG PO TABS
50.0000 mg | ORAL_TABLET | Freq: Every day | ORAL | Status: DC
  Administered 2013-04-18: 50 mg via ORAL
  Filled 2013-04-18 (×3): qty 1

## 2013-04-18 MED ORDER — LACTATED RINGERS IV SOLN
INTRAVENOUS | Status: DC | PRN
  Administered 2013-04-18 (×2): via INTRAVENOUS

## 2013-04-18 SURGICAL SUPPLY — 48 items
BUR ROUND FLUTED 4 SOFT TCH (BURR) ×2 IMPLANT
CLOTH BEACON ORANGE TIMEOUT ST (SAFETY) ×2 IMPLANT
CORDS BIPOLAR (ELECTRODE) ×2 IMPLANT
COVER SURGICAL LIGHT HANDLE (MISCELLANEOUS) ×2 IMPLANT
DERMABOND ADVANCED (GAUZE/BANDAGES/DRESSINGS) ×1
DERMABOND ADVANCED .7 DNX12 (GAUZE/BANDAGES/DRESSINGS) ×1 IMPLANT
DRAPE MICROSCOPE LEICA (MISCELLANEOUS) ×2 IMPLANT
DRAPE PROXIMA HALF (DRAPES) ×4 IMPLANT
DRSG EMULSION OIL 3X3 NADH (GAUZE/BANDAGES/DRESSINGS) ×2 IMPLANT
DRSG MEPILEX BORDER 4X4 (GAUZE/BANDAGES/DRESSINGS) ×2 IMPLANT
DRSG MEPILEX BORDER 4X8 (GAUZE/BANDAGES/DRESSINGS) ×2 IMPLANT
DURAPREP 26ML APPLICATOR (WOUND CARE) ×2 IMPLANT
ELECT REM PT RETURN 9FT ADLT (ELECTROSURGICAL) ×2
ELECTRODE REM PT RTRN 9FT ADLT (ELECTROSURGICAL) ×1 IMPLANT
GLOVE BIOGEL PI IND STRL 7.5 (GLOVE) ×1 IMPLANT
GLOVE BIOGEL PI IND STRL 8 (GLOVE) ×1 IMPLANT
GLOVE BIOGEL PI INDICATOR 7.5 (GLOVE) ×1
GLOVE BIOGEL PI INDICATOR 8 (GLOVE) ×1
GLOVE ECLIPSE 7.0 STRL STRAW (GLOVE) ×2 IMPLANT
GLOVE ORTHO TXT STRL SZ7.5 (GLOVE) ×2 IMPLANT
GOWN PREVENTION PLUS LG XLONG (DISPOSABLE) ×2 IMPLANT
GOWN STRL NON-REIN LRG LVL3 (GOWN DISPOSABLE) ×4 IMPLANT
KIT BASIN OR (CUSTOM PROCEDURE TRAY) ×2 IMPLANT
KIT ROOM TURNOVER OR (KITS) ×2 IMPLANT
MANIFOLD NEPTUNE II (INSTRUMENTS) IMPLANT
NDL SUT .5 MAYO 1.404X.05X (NEEDLE) ×1 IMPLANT
NEEDLE HYPO 25GX1X1/2 BEV (NEEDLE) ×2 IMPLANT
NEEDLE MAYO TAPER (NEEDLE) ×1
NEEDLE SPNL 18GX3.5 QUINCKE PK (NEEDLE) ×2 IMPLANT
NS IRRIG 1000ML POUR BTL (IV SOLUTION) ×2 IMPLANT
PACK LAMINECTOMY ORTHO (CUSTOM PROCEDURE TRAY) ×2 IMPLANT
PAD ARMBOARD 7.5X6 YLW CONV (MISCELLANEOUS) ×4 IMPLANT
PATTIES SURGICAL .5 X.5 (GAUZE/BANDAGES/DRESSINGS) ×2 IMPLANT
PATTIES SURGICAL .75X.75 (GAUZE/BANDAGES/DRESSINGS) IMPLANT
SPONGE GAUZE 4X4 12PLY (GAUZE/BANDAGES/DRESSINGS) ×2 IMPLANT
SUT VIC AB 0 CT1 27 (SUTURE) ×1
SUT VIC AB 0 CT1 27XBRD ANBCTR (SUTURE) ×1 IMPLANT
SUT VIC AB 2-0 CT1 27 (SUTURE) ×2
SUT VIC AB 2-0 CT1 TAPERPNT 27 (SUTURE) ×2 IMPLANT
SUT VICRYL 0 TIES 12 18 (SUTURE) ×2 IMPLANT
SUT VICRYL 0 UR6 27IN ABS (SUTURE) ×2 IMPLANT
SUT VICRYL 4-0 PS2 18IN ABS (SUTURE) ×2 IMPLANT
SUT VICRYL AB 2 0 TIES (SUTURE) ×2 IMPLANT
SYR 20ML ECCENTRIC (SYRINGE) IMPLANT
SYR CONTROL 10ML LL (SYRINGE) ×2 IMPLANT
TOWEL OR 17X24 6PK STRL BLUE (TOWEL DISPOSABLE) ×2 IMPLANT
TOWEL OR 17X26 10 PK STRL BLUE (TOWEL DISPOSABLE) ×2 IMPLANT
WATER STERILE IRR 1000ML POUR (IV SOLUTION) ×2 IMPLANT

## 2013-04-18 NOTE — Op Note (Signed)
Test test  Preop diagnosis L4-5 degenerative disc with the annular tear, right.  Postoperative diagnosis: Same  Procedure: Right L4-5 microdiscectomy, laminotomy.  Surgeon: Annell Greening M.D.  Asst.: Maud Deed PA-C medically necessary and present for the entire procedure  EBL: Minimal  Anesthesia: GOT plus Marcaine skin local  Brief history: this 42 year old female had an on-the-job injury with persistent pain  at least two  MRIs and a discogram as well as EMGs have Darl Pikes consistent with L5 nerve root compression and discogram showed an annular tear with positive discogram causing compression. She been treated conservatively including epidural steroids physical therapy pain medication without relief. Patient had some left leg symptoms and disc protrusion was a central broad-based was some pericentral right and also left. There was only mild compression on supine MRI scan and most recent MRI showed some evidence of disc progression of degeneration.  Procedure: After induction general anesthesia operculum intubation patient was placed prone on chest rolls careful padding shoulder rolls and pillows underneath the leg arms at 9090. Back was prepped with DuraPrep squared with towels Betadine Steri-Drape application and laminectomy sheet and drapes. The localization with spinal needle initially was a little high. Iliac crest and spinous process were somewhat difficult to palpate due to body habitus. First radiograph showed needle was at 34 was moved caudally reimaged appropriate level LIV-5 and incision was made dissection down to the lamina with the extra deep talar tract were acquired. Second x-ray was taken with Glorious Peach at the inferior aspect of the L4 lamina. Radiograph confirmed proper position. Upper microscopist raped brought in laminotomy was performed after the bone and then Loraine Leriche there was thick Giemsa ligament which were removed overhanging facet spurs which were more impressive than the MRI  scan. Patient did have some increased fluid and L4-5 facets both right and left and there was mild hypermobility without anterolisthesis. Arrange spurs are turned back Giemsa ligament removed dura was visualized some veins in the lateral gutter were coagulated with the fine-tipped extra long bipolar bipolar cautery which was insulated. Disc was bulging a mildly soft ballotable and Bainter cleaned off cauterize and then annulus was incised and the soft Israel with the 15 scalpel and ask and then for for specimen the disc space. Extra long pituitaries and straight were used for microdiscectomy there was some chunks of disc immediately underneath the posterior longitudinal ligament which were removed. Extra long hockey-stick was used for palpation underneath annulus pushing some disc material to the center the distant and removing with pituitary. Continued him work in the disc until disc was flat. Arrange spurs removed foraminotomy was enlarged nerve root was palpated anterior and lateral to the nerve root . Ligament was removed to the nerve root was completely free. Hockey-stick could be swept anterior to the dura 180 with Noris compression there were no free fragments. This was flat additional passes were made in the disc both with the hockey-stick underneath the ligamentum and the midline palpation across the midline and above the ligament with no remaining areas compression. Copious irrigation lateral gutter was dry. Operative microscope was removed and operative site was irrigated with the saline solution. 0 Vicryl the fascia 2-0 Vicryl subcutaneous tissue subcuticular skin closure Dermabond postop dressing and transferred to recovery room in stable condition. Instrument count needle count was correct.

## 2013-04-18 NOTE — Transfer of Care (Signed)
Immediate Anesthesia Transfer of Care Note  Patient: Cynthia Saunders  Procedure(s) Performed: Procedure(s) with comments: MICRODISCECTOMY LUMBAR LAMINECTOMY (Right) - Right L4-5 Microdiscectomy  Patient Location: PACU  Anesthesia Type:General  Level of Consciousness: awake, alert  and oriented  Airway & Oxygen Therapy: Patient Spontanous Breathing and Patient connected to nasal cannula oxygen  Post-op Assessment: Report given to PACU RN, Post -op Vital signs reviewed and stable and Patient moving all extremities X 4  Post vital signs: Reviewed and stable  Complications: No apparent anesthesia complications

## 2013-04-18 NOTE — Progress Notes (Signed)
Patient stated that she has been having trouble wearing her CPAP at home due to nasal congestion and does not wish to try to wear our hospital CPAP at this time.  RT made patient aware of the need of wearing CPAP for sleep apnea and explained to her that if she changed her mind and wanted to try to wear our CPAP that RT would bring a machine back to floor and set her up.

## 2013-04-18 NOTE — Preoperative (Signed)
Beta Blockers   Reason not to administer Beta Blockers:Not Applicable 

## 2013-04-18 NOTE — Interval H&P Note (Signed)
History and Physical Interval Note:  04/18/2013 7:22 AM  [name]  has presented today for surgery, with the diagnosis of L4-5 HNP (herniated nucleus pulposus)  The various methods of treatment have been discussed with the patient and family. After consideration of risks, benefits and other options for treatment, the patient has consented to  Procedure(s) with comments: MICRODISCECTOMY LUMBAR LAMINECTOMY (Right) - Right L4-5 Microdiscectomy as a surgical intervention .  The patient's history has been reviewed, patient examined, no change in status, stable for surgery.  I have reviewed the patient's chart and labs.  Questions were answered to the patient's satisfaction.     Cynthia Saunders C

## 2013-04-18 NOTE — Anesthesia Preprocedure Evaluation (Addendum)
Anesthesia Evaluation  Patient identified by MRN, date of birth, ID band  Reviewed: Allergy & Precautions, H&P , NPO status , Patient's Chart, lab work & pertinent test results  Airway Mallampati: I  Neck ROM: Full    Dental  (+) Teeth Intact   Pulmonary shortness of breath, sleep apnea ,  Sarcoid breath sounds clear to auscultation        Cardiovascular hypertension, Rhythm:Regular Rate:Normal     Neuro/Psych  Headaches,    GI/Hepatic GERD-  ,  Endo/Other    Renal/GU      Musculoskeletal   Abdominal   Peds  Hematology   Anesthesia Other Findings   Reproductive/Obstetrics                         Anesthesia Physical Anesthesia Plan  ASA: III  Anesthesia Plan: General   Post-op Pain Management:    Induction: Intravenous  Airway Management Planned: Oral ETT  Additional Equipment:   Intra-op Plan:   Post-operative Plan: Extubation in OR  Informed Consent:   Dental advisory given  Plan Discussed with: CRNA and Surgeon  Anesthesia Plan Comments:         Anesthesia Quick Evaluation

## 2013-04-18 NOTE — Brief Op Note (Signed)
04/18/2013  9:11 AM  PATIENT:  Randall An  42 y.o. female  PRE-OPERATIVE DIAGNOSIS:  L4-5 HNP (herniated nucleus pulposus)  POST-OPERATIVE DIAGNOSIS:  L4-5 HNP (herniated nucleus pulposus)  PROCEDURE:  Procedure(s) with comments: MICRODISCECTOMY LUMBAR LAMINECTOMY (Right) - Right L4-5 Microdiscectomy  SURGEON:  Surgeon(s) and Role:    * Eldred Manges, MD - Primary  PHYSICIAN ASSISTANT: Maud Deed PAC  ASSISTANTS: none   ANESTHESIA:   general  EBL:     BLOOD ADMINISTERED:none  DRAINS: none   LOCAL MEDICATIONS USED:  MARCAINE     SPECIMEN:  No Specimen  DISPOSITION OF SPECIMEN:  N/A  COUNTS:  YES  TOURNIQUET:  * No tourniquets in log *  DICTATION: .Note written in EPIC  PLAN OF CARE: Admit for overnight observation  PATIENT DISPOSITION:  PACU - hemodynamically stable.   Delay start of Pharmacological VTE agent (>24hrs) due to surgical blood loss or risk of bleeding: yes

## 2013-04-18 NOTE — Anesthesia Procedure Notes (Signed)
Procedure Name: Intubation Date/Time: 04/18/2013 7:38 AM Performed by: Quentin Ore Pre-anesthesia Checklist: Patient identified, Emergency Drugs available, Suction available, Patient being monitored and Timeout performed Patient Re-evaluated:Patient Re-evaluated prior to inductionOxygen Delivery Method: Circle system utilized Preoxygenation: Pre-oxygenation with 100% oxygen Intubation Type: IV induction Ventilation: Mask ventilation without difficulty and Oral airway inserted - appropriate to patient size Laryngoscope Size: Mac and 3 Grade View: Grade II Tube size: 7.0 mm Number of attempts: 1 Airway Equipment and Method: Stylet Placement Confirmation: ETT inserted through vocal cords under direct vision,  positive ETCO2 and breath sounds checked- equal and bilateral Secured at: 21 cm Tube secured with: Tape Dental Injury: Teeth and Oropharynx as per pre-operative assessment

## 2013-04-18 NOTE — Anesthesia Postprocedure Evaluation (Signed)
  Anesthesia Post-op Note  Patient: Cynthia Saunders  Procedure(s) Performed: Procedure(s) with comments: MICRODISCECTOMY LUMBAR LAMINECTOMY (Right) - Right L4-5 Microdiscectomy  Patient Location: PACU  Anesthesia Type:General  Level of Consciousness: awake and alert   Airway and Oxygen Therapy: Patient Spontanous Breathing  Post-op Pain: mild  Post-op Assessment: Post-op Vital signs reviewed  Post-op Vital Signs: stable  Complications: No apparent anesthesia complications

## 2013-04-19 DIAGNOSIS — M5126 Other intervertebral disc displacement, lumbar region: Secondary | ICD-10-CM | POA: Diagnosis not present

## 2013-04-19 NOTE — Progress Notes (Signed)
Patient refused CPAP for the night. Patient on room air with SP02=97%

## 2013-04-19 NOTE — Progress Notes (Addendum)
Patient complaining of weakness and pain in right leg. Limping while ambulating in room and to bathroom. Does not want to go home today due to inability to take care of herself alone and she has no one to stay with her tonight. Paged MD on call. Await return call.   Spoke with Dr. August Saucer. Will hold discharge and follow up in am.

## 2013-04-19 NOTE — Progress Notes (Signed)
Subjective: Pt stable - has not been oob yet with PT   Objective: Vital signs in last 24 hours: Temp:  [97 F (36.1 C)-98.7 F (37.1 C)] 98 F (36.7 C) (08/02 0223) Pulse Rate:  [52-91] 81 (08/02 0223) Resp:  [7-21] 16 (08/02 0223) BP: (96-127)/(49-83) 121/62 mmHg (08/02 0223) SpO2:  [99 %-100 %] 99 % (08/02 0223)  Intake/Output from previous day: 08/01 0701 - 08/02 0700 In: 1480 [P.O.:360; I.V.:1120] Out: 479 [Urine:454; Blood:25] Intake/Output this shift:    Exam:  Neurovascular intact Sensation intact distally Dorsiflexion/Plantar flexion intact  Labs: No results found for this basename: HGB,  in the last 72 hours No results found for this basename: WBC, RBC, HCT, PLT,  in the last 72 hours No results found for this basename: NA, K, CL, CO2, BUN, CREATININE, GLUCOSE, CALCIUM,  in the last 72 hours No results found for this basename: LABPT, INR,  in the last 72 hours  Assessment/Plan: Plan to mobilize with PT - if she does ok will plan on dc home later this afternoon   DEAN,GREGORY SCOTT 04/19/2013, 9:08 AM

## 2013-04-20 DIAGNOSIS — M5126 Other intervertebral disc displacement, lumbar region: Secondary | ICD-10-CM | POA: Diagnosis not present

## 2013-04-20 MED ORDER — PANTOPRAZOLE SODIUM 40 MG PO TBEC: 40.0000 mg | DELAYED_RELEASE_TABLET | Freq: Every day | ORAL | Status: DC

## 2013-04-20 NOTE — Progress Notes (Signed)
Subjective: Pt stable - requests walker to use at home   Objective: Vital signs in last 24 hours: Temp:  [98.1 F (36.7 C)-99 F (37.2 C)] 99 F (37.2 C) (08/02 2206) Pulse Rate:  [77-90] 90 (08/02 2206) Resp:  [16-18] 18 (08/02 2206) BP: (110-126)/(57-63) 126/63 mmHg (08/02 2206) SpO2:  [98 %-100 %] 100 % (08/02 2206)  Intake/Output from previous day: 08/02 0701 - 08/03 0700 In: 363 [P.O.:360; I.V.:3] Out: -  Intake/Output this shift:    Exam:  Sensation intact distally Intact pulses distally  Labs: No results found for this basename: HGB,  in the last 72 hours No results found for this basename: WBC, RBC, HCT, PLT,  in the last 72 hours No results found for this basename: NA, K, CL, CO2, BUN, CREATININE, GLUCOSE, CALCIUM,  in the last 72 hours No results found for this basename: LABPT, INR,  in the last 72 hours  Assessment/Plan: Plan dc home today   Cynthia Saunders 04/20/2013, 8:09 AM

## 2013-04-22 ENCOUNTER — Encounter (HOSPITAL_COMMUNITY): Payer: Self-pay | Admitting: Orthopaedic Surgery

## 2013-05-13 ENCOUNTER — Emergency Department (HOSPITAL_COMMUNITY): Payer: Federal, State, Local not specified - PPO

## 2013-05-13 ENCOUNTER — Encounter (HOSPITAL_COMMUNITY): Payer: Self-pay | Admitting: Emergency Medicine

## 2013-05-13 ENCOUNTER — Emergency Department (HOSPITAL_COMMUNITY)
Admission: EM | Admit: 2013-05-13 | Discharge: 2013-05-13 | Disposition: A | Discharge status: Home / Self Care | Payer: Federal, State, Local not specified - PPO | Attending: Emergency Medicine | Admitting: Emergency Medicine

## 2013-05-13 DIAGNOSIS — F329 Major depressive disorder, single episode, unspecified: Secondary | ICD-10-CM | POA: Insufficient documentation

## 2013-05-13 DIAGNOSIS — R0609 Other forms of dyspnea: Secondary | ICD-10-CM | POA: Insufficient documentation

## 2013-05-13 DIAGNOSIS — I10 Essential (primary) hypertension: Secondary | ICD-10-CM | POA: Insufficient documentation

## 2013-05-13 DIAGNOSIS — I209 Angina pectoris, unspecified: Secondary | ICD-10-CM | POA: Insufficient documentation

## 2013-05-13 DIAGNOSIS — Z8669 Personal history of other diseases of the nervous system and sense organs: Secondary | ICD-10-CM | POA: Insufficient documentation

## 2013-05-13 DIAGNOSIS — Z79899 Other long term (current) drug therapy: Secondary | ICD-10-CM | POA: Insufficient documentation

## 2013-05-13 DIAGNOSIS — Z8701 Personal history of pneumonia (recurrent): Secondary | ICD-10-CM | POA: Insufficient documentation

## 2013-05-13 DIAGNOSIS — F3289 Other specified depressive episodes: Secondary | ICD-10-CM | POA: Insufficient documentation

## 2013-05-13 DIAGNOSIS — Z8719 Personal history of other diseases of the digestive system: Secondary | ICD-10-CM | POA: Insufficient documentation

## 2013-05-13 DIAGNOSIS — J45909 Unspecified asthma, uncomplicated: Secondary | ICD-10-CM | POA: Insufficient documentation

## 2013-05-13 DIAGNOSIS — F411 Generalized anxiety disorder: Secondary | ICD-10-CM | POA: Insufficient documentation

## 2013-05-13 DIAGNOSIS — Z853 Personal history of malignant neoplasm of breast: Secondary | ICD-10-CM | POA: Insufficient documentation

## 2013-05-13 DIAGNOSIS — Z8619 Personal history of other infectious and parasitic diseases: Secondary | ICD-10-CM | POA: Insufficient documentation

## 2013-05-13 DIAGNOSIS — R0989 Other specified symptoms and signs involving the circulatory and respiratory systems: Secondary | ICD-10-CM | POA: Insufficient documentation

## 2013-05-13 DIAGNOSIS — IMO0002 Reserved for concepts with insufficient information to code with codable children: Secondary | ICD-10-CM | POA: Insufficient documentation

## 2013-05-13 DIAGNOSIS — R0602 Shortness of breath: Secondary | ICD-10-CM | POA: Insufficient documentation

## 2013-05-13 DIAGNOSIS — Z8739 Personal history of other diseases of the musculoskeletal system and connective tissue: Secondary | ICD-10-CM | POA: Insufficient documentation

## 2013-05-13 LAB — CBC
HCT: 39.4 % (ref 36.0–46.0)
Hemoglobin: 13.1 g/dL (ref 12.0–15.0)
MCH: 28.2 pg (ref 26.0–34.0)
MCHC: 33.2 g/dL (ref 30.0–36.0)
MCV: 84.7 fL (ref 78.0–100.0)
Platelets: 298 10*3/uL (ref 150–400)
RBC: 4.65 MIL/uL (ref 3.87–5.11)
RDW: 13.8 % (ref 11.5–15.5)
WBC: 6.6 10*3/uL (ref 4.0–10.5)

## 2013-05-13 LAB — POCT I-STAT TROPONIN I: Troponin i, poc: 0.02 ng/mL (ref 0.00–0.08)

## 2013-05-13 LAB — BASIC METABOLIC PANEL
BUN: 10 mg/dL (ref 6–23)
CO2: 28 mEq/L (ref 19–32)
Calcium: 10 mg/dL (ref 8.4–10.5)
Chloride: 104 mEq/L (ref 96–112)
Creatinine, Ser: 0.62 mg/dL (ref 0.50–1.10)
GFR calc Af Amer: >90 mL/min (ref >90)
GFR calc non Af Amer: >90 mL/min (ref >90)
Glucose, Bld: 97 mg/dL (ref 70–99)
Potassium: 3.6 mEq/L (ref 3.5–5.1)
Sodium: 142 mEq/L (ref 135–145)

## 2013-05-13 LAB — D-DIMER, QUANTITATIVE: D-Dimer, Quant: 0.52 ug/mL-FEU — ABNORMAL HIGH (ref 0.00–0.48)

## 2013-05-13 MED ORDER — ASPIRIN 81 MG PO CHEW: 81.0000 mg | CHEWABLE_TABLET | Freq: Every day | ORAL | Status: DC

## 2013-05-13 MED ORDER — IOHEXOL 350 MG/ML SOLN
100.0000 mL | Freq: Once | INTRAVENOUS | Status: AC | PRN
  Administered 2013-05-13: 100 mL via INTRAVENOUS

## 2013-05-13 MED ORDER — SODIUM CHLORIDE 0.9 % IV BOLUS (SEPSIS)
1000.0000 mL | Freq: Once | INTRAVENOUS | Status: AC
  Administered 2013-05-13: 1000 mL via INTRAVENOUS

## 2013-05-13 MED ORDER — ASPIRIN 81 MG PO CHEW
324.0000 mg | CHEWABLE_TABLET | Freq: Once | ORAL | Status: AC
  Administered 2013-05-13: 324 mg via ORAL
  Filled 2013-05-13: qty 4

## 2013-05-13 NOTE — ED Provider Notes (Signed)
CSN: [id number]     Arrival date & time 05/13/13  1252 History   First MD Initiated Contact with Patient 05/13/13 1524     Chief Complaint  Patient presents with  . Chest Pain   (Consider location/radiation/quality/duration/timing/severity/associated sxs/prior Treatment) HPI Comments: Pt is a 42 y/o F who comes in with cc of chest pain. Pt has no medical hx, had a back surgery earlier this month. Reports that for the past 3 days she has been having sharp, stabbing pain to the left side of her chest that is intermittent, and is worse with deep inspiration and with coughing. Pt typically notices the pain when she is exerting herself, and has associated dyspnea.  No hx of PE, DVT, and no risk factors, besides the surgery for the same. She also reports having premature CAD in the family, with her mother's side having heart attacks in the 40s. No provocative testing done for her, and no hx of chest pains.  Patient is a 42 y.o. female presenting with chest pain. The history is provided by the patient.  Chest Pain Associated symptoms: shortness of breath   Associated symptoms: no abdominal pain, no headache, no nausea, no palpitations and not vomiting     Past Medical History  Diagnosis Date  . Breast cancer 2009    triple negative left breast  . Anxiety   . Depression   . Arthritis   . Breast cancer 11/28/2007    Left breast stage T3N1, triple negative  . BRCA1 genetic carrier 01/27/2013  . Hypertension     during pregnancy 2002  . Sarcoidosis   . Asthma   . Pollen allergies   . Sleep apnea   . GERD (gastroesophageal reflux disease)     watches what she eats  . Headache(784.0)     thinks due to prozac  . Neuromuscular disorder     neuropathy legs started after chemo  . Pneumonia     2010, post surgery  . Shortness of breath    Past Surgical History  Procedure Laterality Date  . Bilateral mastectomy Bilateral 2009    left sided breast cancer and prophylactic right  . Abdominal  hysterectomy    . Reconstruction breast w/ tram flap Bilateral 2011    Done at [facility]  . Breast surgery    . Cesarean section      x 2  . Mastectomy    . Tubal ligation    . Colonoscopy  2010  . Lumbar laminectomy Right 04/18/2013    Procedure: MICRODISCECTOMY LUMBAR LAMINECTOMY;  Surgeon: Eldred Manges, MD;  Location: [facility] OR;  Service: Orthopedics;  Laterality: Right;  Right L4-5 Microdiscectomy   Family History  Problem Relation Age of Onset  . Diabetes Mother   . Hypertension Mother   . Hypertension Father   . Diabetes Father   . Hypertension Sister   . Cancer Paternal Aunt 83    breast cancer died at 77  . Cancer Paternal Uncle     lung cancer - 3 uncles  . Diabetes Paternal Grandmother   . Cancer Paternal Grandfather     lung cancer  . Cancer Cousin 30    breast cancer deceased at 8   History  Substance Use Topics  . Smoking status: Never Smoker   . Smokeless tobacco: Never Used  . Alcohol Use: 0.6 oz/week    1 Glasses of wine per week     Comment: at holidays   OB History   Grav  Para Term Preterm Abortions TAB SAB Ect Mult Living                 Review of Systems  Constitutional: Negative for activity change.  HENT: Negative for neck pain.   Respiratory: Positive for shortness of breath.   Cardiovascular: Positive for chest pain. Negative for palpitations.  Gastrointestinal: Negative for nausea, vomiting and abdominal pain.  Genitourinary: Negative for dysuria.  Neurological: Negative for headaches.    Allergies  Prozac; Adhesive; and Chlorhexidine  Home Medications   Current Outpatient Rx  Name  Route  Sig  Dispense  Refill  . beclomethasone (QVAR) 40 MCG/ACT inhaler   Inhalation   Inhale 2 puffs into the lungs 2 (two) times daily.         . fexofenadine (ALLEGRA) 30 MG tablet   Oral   Take 30 mg by mouth daily.         . Fish Oil-Cholecalciferol (FISH OIL + D3) 1200-1000 MG-UNIT CAPS   Oral   Take 1 capsule by mouth daily.          . methocarbamol (ROBAXIN) 500 MG tablet   Oral   Take 1 tablet (500 mg total) by mouth every 6 (six) hours as needed (spasm).   30 tablet   0   . mometasone (NASONEX) 50 MCG/ACT nasal spray   Nasal   Place 2 sprays into the nose daily.         . Multiple Vitamins-Minerals (MULTIVITAMIN PO)   Oral   Take 1 tablet by mouth daily.          . naproxen (NAPROSYN) 500 MG tablet   Oral   Take 500 mg by mouth 2 (two) times daily with a meal.         . oxyCODONE-acetaminophen (ROXICET) 5-325 MG per tablet   Oral   Take 1-2 tablets by mouth every 4 (four) hours as needed for pain.   60 tablet   0   . sertraline (ZOLOFT) 100 MG tablet   Oral   Take 100 mg by mouth daily.          . SYMBICORT 160-4.5 MCG/ACT inhaler   Inhalation   Inhale 2 puffs into the lungs 2 (two) times daily.          Marland Kitchen albuterol (PROVENTIL HFA;VENTOLIN HFA) 108 (90 BASE) MCG/ACT inhaler   Inhalation   Inhale 2 puffs into the lungs every 6 (six) hours as needed for wheezing.         . traZODone (DESYREL) 50 MG tablet   Oral   Take 50 mg by mouth at bedtime as needed for sleep.           BP 141/104  Pulse 80  Temp(Src) 98.5 F (36.9 C) (Oral)  Resp 16  SpO2 100% Physical Exam  Nursing note and vitals reviewed. Constitutional: She is oriented to person, place, and time. She appears well-developed and well-nourished.  HENT:  Head: Normocephalic and atraumatic.  Eyes: EOM are normal. Pupils are equal, round, and reactive to light.  Neck: Neck supple.  Cardiovascular: Normal rate, regular rhythm and normal heart sounds.   No murmur heard. Pulmonary/Chest: Effort normal. No respiratory distress.  Abdominal: Soft. She exhibits no distension. There is no tenderness. There is no rebound and no guarding.  Musculoskeletal: She exhibits no edema and no tenderness.  Neurological: She is alert and oriented to person, place, and time.  Skin: Skin is warm and dry.    ED  Course  Procedures  (including critical care time) Labs Review Labs Reviewed  CBC  BASIC METABOLIC PANEL  D-DIMER, QUANTITATIVE  POCT I-STAT TROPONIN I   Imaging Review Dg Chest 2 View  05/13/2013   *RADIOLOGY REPORT*  Clinical Data: Chest pain  CHEST - 2 VIEW  Comparison: 04/10/2013  Findings: Heart size and vascularity are normal.  Lungs are clear without infiltrate or effusion.  No mass lesion.  Surgical clips overlie the chest bilaterally due to prior breast reconstruction bilaterally.  IMPRESSION: No active cardiopulmonary disease.  No interval change.   Original Report Authenticated By: Janeece Riggers, M.D.    MDM  No diagnosis found.  Date: 05/13/2013  Rate: 84  Rhythm: normal sinus rhythm  QRS Axis: normal  Intervals: normal  ST/T Wave abnormalities: non specific changes  Conduction Disutrbances: none  Narrative Interpretation: unremarkable   Pt comes in with cc of chest pain.  Differential diagnosis includes: ACS syndrome Valvular disorder Pericardial effusion Pneumonia PE Anemia Bronchitis  Based on exam and CXR, no evidence of bronchitis. No musculoksletal component to the pain either. She has a recent surgery hx, WELLS score for PE is 1.5 - we will get d-dimer. Additionally, her left sided chest pain, that moves towards the shoulder and has exertional dyspnea with it is complicated by the signifcant fam hx of premature CAD in the family.  We will get a PE rule out in the ED, and try to contact PCP for Cardiac workup, or admit.  7:29 PM Spoke with Dr. Piedad Climes - patient's PCP. She will initiate outpatient cardiac workup, if PE workup is negative in the ED. Pt understands the plan and agrees.  Derwood Kaplan, MD 05/13/13 1930

## 2013-05-13 NOTE — ED Notes (Signed)
I was informed by previous tech that this pt did not want to be hooked up to monitor until she knew that she had a babysitter because she did not think she would be here very long.

## 2013-05-13 NOTE — ED Notes (Addendum)
Pt c/o left sided CP in rib area into chest and left arm x 3 days; pt denies new SOB; pt had recent sx and sent for eval for possible PE

## 2013-05-16 NOTE — Discharge Summary (Signed)
Physician Discharge Summary  Patient ID: Cynthia Saunders MRN: [id number] DOB/AGE: 1970/10/09 42 y.o.  Admit date: 04/18/2013 Discharge date: 04/19/2013  Admission Diagnoses:  HNP (herniated nucleus pulposus), lumbar right L4-5  Discharge Diagnoses:  Principal Problem:   HNP (herniated nucleus pulposus), lumbar right L4-5  Past Medical History  Diagnosis Date  . Breast cancer 2009    triple negative left breast  . Anxiety   . Depression   . Arthritis   . Breast cancer 11/28/2007    Left breast stage T3N1, triple negative  . BRCA1 genetic carrier 01/27/2013  . Hypertension     during pregnancy 2002  . Sarcoidosis   . Asthma   . Pollen allergies   . Sleep apnea   . GERD (gastroesophageal reflux disease)     watches what she eats  . Headache(784.0)     thinks due to prozac  . Neuromuscular disorder     neuropathy legs started after chemo  . Pneumonia     2010, post surgery  . Shortness of breath     Surgeries: Procedure(s): MICRODISCECTOMY LUMBAR LAMINECTOMY on 04/18/2013   Consultants (if any):  none  Discharged Condition: Improved  Hospital Course: Cynthia Saunders is an 42 y.o. female who was admitted 04/18/2013 with a diagnosis of HNP (herniated nucleus pulposus), lumbar and went to the operating room on 04/18/2013 and underwent the above named procedures.    She was given perioperative antibiotics:      Anti-infectives   Start     Dose/Rate Route Frequency Ordered Stop   04/18/13 1600  ceFAZolin (ANCEF) IVPB 1 g/50 mL premix     1 g 100 mL/hr over 30 Minutes Intravenous Every 8 hours 04/18/13 1337 04/19/13 0052   04/18/13 0600  ceFAZolin (ANCEF) IVPB 2 g/50 mL premix     2 g 100 mL/hr over 30 Minutes Intravenous On call to O.R. 04/17/13 1440 04/18/13 0745    .  She was given sequential compression devices, early ambulation for DVT prophylaxis.  She benefited maximally from the hospital stay and there were no complications.    Recent vital signs:  Filed Vitals:   04/19/13 2206  BP: 126/63  Pulse: 90  Temp: 99 F (37.2 C)  Resp: 18    Recent laboratory studies:  Lab Results  Component Value Date   HGB 13.1 05/13/2013   HGB 13.1 04/10/2013   HGB 12.2 03/25/2013   Lab Results  Component Value Date   WBC 6.6 05/13/2013   PLT 298 05/13/2013   Lab Results  Component Value Date   INR 1.00 04/10/2013   Lab Results  Component Value Date   NA 142 05/13/2013   K 3.6 05/13/2013   CL 104 05/13/2013   CO2 28 05/13/2013   BUN 10 05/13/2013   CREATININE 0.62 05/13/2013   GLUCOSE 97 05/13/2013    Discharge Medications:     Medication List         albuterol 108 (90 BASE) MCG/ACT inhaler  Commonly known as:  PROVENTIL HFA;VENTOLIN HFA  Inhale 2 puffs into the lungs every 6 (six) hours as needed for wheezing.     beclomethasone 40 MCG/ACT inhaler  Commonly known as:  QVAR  Inhale 2 puffs into the lungs 2 (two) times daily.     fexofenadine 30 MG tablet  Commonly known as:  ALLEGRA  Take 30 mg by mouth daily.     FISH OIL + D3 1200-1000 MG-UNIT Caps  Take 1 capsule by mouth daily.  methocarbamol 500 MG tablet  Commonly known as:  ROBAXIN  Take 1 tablet (500 mg total) by mouth every 6 (six) hours as needed (spasm).     mometasone 50 MCG/ACT nasal spray  Commonly known as:  NASONEX  Place 2 sprays into the nose daily.     MULTIVITAMIN PO  Take 1 tablet by mouth daily.     naproxen 500 MG tablet  Commonly known as:  NAPROSYN  Take 500 mg by mouth 2 (two) times daily with a meal.     oxyCODONE-acetaminophen 5-325 MG per tablet  Commonly known as:  ROXICET  Take 1-2 tablets by mouth every 4 (four) hours as needed for pain.     sertraline 100 MG tablet  Commonly known as:  ZOLOFT  Take 100 mg by mouth daily.     SYMBICORT 160-4.5 MCG/ACT inhaler  Generic drug:  budesonide-formoterol  Inhale 2 puffs into the lungs 2 (two) times daily.     traZODone 50 MG tablet  Commonly known as:  DESYREL  Take 50 mg by mouth at bedtime as needed  for sleep.        Diagnostic Studies: Dg Chest 2 View  05/13/2013   *RADIOLOGY REPORT*  Clinical Data: Chest pain  CHEST - 2 VIEW  Comparison: 04/10/2013  Findings: Heart size and vascularity are normal.  Lungs are clear without infiltrate or effusion.  No mass lesion.  Surgical clips overlie the chest bilaterally due to prior breast reconstruction bilaterally.  IMPRESSION: No active cardiopulmonary disease.  No interval change.   Original Report Authenticated By: Janeece Riggers, M.D.   Dg Lumbar Spine 2-3 Views  04/18/2013   *RADIOLOGY REPORT*  Clinical Data:  L4-5 microdiskectomy.  LUMBAR SPINE - 2-3 VIEW  Comparison: MRI 01/22/2013  Findings: First cross-table lateral view of the lumbar spine demonstrates a posterior needle directed at the L4-5 disc space level.  Second intraoperative image demonstrates a posterior needle directed at the L3-4 level.  The third intraoperative image demonstrates posterior surgical instruments at the L4-5 level.  IMPRESSION: Intraoperative localization as above.   Original Report Authenticated By: Charlett Nose, M.D.   Ct Angio Chest Pe W/cm &/or Wo Cm  05/13/2013   *RADIOLOGY REPORT*  Clinical Data: Chest pain  CT ANGIOGRAPHY CHEST  Technique:  Multidetector CT imaging of the chest using the standard protocol during bolus administration of intravenous contrast. Multiplanar reconstructed images including MIPs were obtained and reviewed to evaluate the vascular anatomy.  Contrast: OMNIPAQUE IOHEXOL 350 MG/ML SOLN  Comparison: 10/15/2005.  Findings: Sagittal images of the spine shows mild degenerative changes thoracic spine.  Images of the thoracic inlet are unremarkable.  Central airways are patent.  No aortic aneurysm.  The study is of excellent technical quality.  No pulmonary embolus is noted.  No rib fractures are noted.  Images of the lung parenchyma shows no acute infiltrate or pulmonary edema.  No focal consolidation.  Minimal atelectasis noted lung bases  posteriorly.  There is no mediastinal hematoma or adenopathy.  No hilar adenopathy.  No axillary adenopathy.  IMPRESSION:  1.  No pulmonary embolus is noted. 2.  No hilar or mediastinal adenopathy. 3.  No acute infiltrate or pulmonary edema. 4.  Mild degenerative changes thoracic spine.   Original Report Authenticated By: Natasha Mead, M.D.    Disposition: 01-Home or Self Care  Discharge Orders   Future Appointments Provider Department Dept Phone   10/13/2013 12:00 PM Krista Blue [facility] MEDICAL ONCOLOGY [phone]  10/13/2013 12:30 PM Victorino December, MD [facility] MEDICAL ONCOLOGY [phone]   Future Orders Complete By Expires   Call MD / Call 911  As directed    Comments:     If you experience chest pain or shortness of breath, CALL 911 and be transported to the hospital emergency room.  If you develope a fever above 101 F, pus (white drainage) or increased drainage or redness at the wound, or calf pain, call your surgeon's office.   Constipation Prevention  As directed    Comments:     Drink plenty of fluids.  Prune juice may be helpful.  You may use a stool softener, such as Colace (over the counter) 100 mg twice a day.  Use MiraLax (over the counter) for constipation as needed.   Diet - low sodium heart healthy  As directed    Increase activity slowly as tolerated  As directed     No lifting greater than 10 lbs. Avoid bending, stooping and twisting. Walk in house for first week them may start to get out slowly increasing distance up to one mile by 3 weeks post op. Keep incision dry for 3 days, may use tegaderm or similar water impervious dressing. Ice to back as needed for pain and swelling Change dressing daily or as needed.  Follow-up Information   Follow up with Eldred Manges, MD. Schedule an appointment as soon as possible for a visit in 2 weeks. (or as scheduled)    Specialty:  Orthopedic Surgery   Contact information:   [address] [place] [address] [phone]        Signed: Wende Neighbors 05/16/2013, 4:02 PM

## 2013-10-13 ENCOUNTER — Ambulatory Visit: Payer: Federal, State, Local not specified - PPO | Admitting: Oncology

## 2013-10-13 ENCOUNTER — Other Ambulatory Visit: Payer: Federal, State, Local not specified - PPO

## 2014-08-27 ENCOUNTER — Telehealth: Payer: Self-pay | Admitting: *Deleted

## 2014-08-27 NOTE — Telephone Encounter (Signed)
Attempted to call patient with no answer of voicemail.  Wanted to make sure she is being followed up by someone. Will try again.

## 2016-10-24 ENCOUNTER — Telehealth (INDEPENDENT_AMBULATORY_CARE_PROVIDER_SITE_OTHER): Payer: Self-pay | Admitting: Orthopaedic Surgery

## 2016-10-24 NOTE — Telephone Encounter (Signed)
[facility] faxed a a letter that was faxed on on 10/11/15 for pt release of information.    she wants to see the status of this.  [phone] Lattie Haw

## 2016-10-24 NOTE — Telephone Encounter (Signed)
Do you know anything about this? 

## 2016-10-25 NOTE — Telephone Encounter (Signed)
I CALLED AND ADVISED LISA THAT RECORDS WERE FAXED ON 08/23/2016.  SHE STATED SHE SENT A FAX YESTERDAY THAT ASKED DR YATES A COUPLE OF QUESTIONS ABOUT PATIENTS ABILITIES BUT THAT SHE REALLY DIDN'T NEED IT ANYMORE THAT SHE WAS ABLE TO APPROVE PATIENT FOR THEIR SERVICES BASED ON INFORMATION SHE HAD.

## 2017-02-21 ENCOUNTER — Ambulatory Visit (INDEPENDENT_AMBULATORY_CARE_PROVIDER_SITE_OTHER): Payer: Self-pay | Admitting: Orthopaedic Surgery

## 2017-03-27 ENCOUNTER — Ambulatory Visit (INDEPENDENT_AMBULATORY_CARE_PROVIDER_SITE_OTHER): Payer: Self-pay | Admitting: Orthopaedic Surgery

## 2017-04-17 ENCOUNTER — Ambulatory Visit (INDEPENDENT_AMBULATORY_CARE_PROVIDER_SITE_OTHER): Admitting: Orthopaedic Surgery

## 2017-04-17 ENCOUNTER — Telehealth (INDEPENDENT_AMBULATORY_CARE_PROVIDER_SITE_OTHER): Payer: Self-pay | Admitting: Orthopaedic Surgery

## 2017-04-17 ENCOUNTER — Encounter (INDEPENDENT_AMBULATORY_CARE_PROVIDER_SITE_OTHER): Payer: Self-pay | Admitting: Orthopaedic Surgery

## 2017-04-17 VITALS — BP 128/88 | HR 88 | Ht 67.5 in | Wt 242.0 lb

## 2017-04-17 DIAGNOSIS — M25511 Pain in right shoulder: Secondary | ICD-10-CM

## 2017-04-17 DIAGNOSIS — M5136 Other intervertebral disc degeneration, lumbar region: Secondary | ICD-10-CM

## 2017-04-17 MED ORDER — MELOXICAM 7.5 MG PO TABS: 7.5000 mg | ORAL_TABLET | Freq: Every day | ORAL | 4 refills | Status: DC

## 2017-04-17 NOTE — Telephone Encounter (Signed)
Returned call to patient concerning appointment today. Patient is traveling from  Out of town. Advised patient if she can be here at 3:30pm she can still be seen.  [phone]

## 2017-04-17 NOTE — Progress Notes (Signed)
Office Visit Note   Patient: Cynthia Saunders           Date of Birth: 07-28-1971           MRN: [id number] Visit Date: 04/17/2017              Requested by: Loraine Leriche., MD [address] [place], [place] [address] PCP: Francee Piccolo, MD   Assessment & Plan: Visit Diagnoses:  1. Right shoulder pain, unspecified chronicity   2. Degenerative lumbar disc     Plan: Patient has been previously radiated. No indication for additional studies. I encouraged her to   work on weight loss to help her diabetes and to improve her lumbar symptoms. Meloxicam 7.5 mg by mouth daily prescribed. We discussed joining a gym, finishing school, resuming work activities. Recommendations as previously stated avoiding overhead lifting, repetitive bending, stooping.  Follow-Up Instructions: Return if symptoms worsen or fail to improve.   Orders:  No orders of the defined types were placed in this encounter.  Meds ordered this encounter  Medications  . meloxicam (MOBIC) 7.5 MG tablet    Sig: Take 1 tablet (7.5 mg total) by mouth daily.    Dispense:  30 tablet    Refill:  4      Procedures: No procedures performed   Clinical Data: No additional findings.   Subjective: Chief Complaint  Patient presents with  . Right Shoulder - Pain, Follow-up    HPI 46 year old female was injured working for [employer] ,4 years ago. She had a right L4-5 microdiscectomy on 04/18/2013 by me. She is in school full-time and has not been back to work. Patient had previous MRI of her shoulder which showed some tendinopathy but no complete tear of the rotator cuff. She states she still has some discomfort in the right shoulder. She states her shoulder aches at night. Currently not on any anti-inflammatories.  Review of Systems review of systems positive for L4-5 microdiscectomy 2014. Positive for diabetes on metformin. Process for breast cancer and above listed on-the-job injury. Negative for MI PAD seizures. Positive  for obesity with BMI 37.34.   Objective: Vital Signs: BP 128/88   Pulse 88   Ht 5' 7.5" (1.715 m)   Wt 242 lb (109.8 kg)   BMI 37.34 kg/m   Physical Exam  Constitutional: She is oriented to person, place, and time. She appears well-developed.  HENT:  Head: Normocephalic.  Right Ear: External ear normal.  Left Ear: External ear normal.  Eyes: Pupils are equal, round, and reactive to light.  Neck: No tracheal deviation present. No thyromegaly present.  Cardiovascular: Normal rate.   Pulmonary/Chest: Effort normal.  Abdominal: Soft.  Musculoskeletal:  Negative drop arm test right. Mild discomfort with impingement . She can get her arm up overhead. Elbows reach full extension. No thenar or hyperthenar atrophy. Reflexes are 2+ and symmetrical upper and lower extremities. Normal heel-to-toe gait.  Neurological: She is alert and oriented to person, place, and time.  Skin: Skin is warm and dry.  Psychiatric: She has a normal mood and affect. Her behavior is normal.    Ortho Exam  Specialty Comments:  No specialty comments available.  Imaging: No results found.   PMFS History: Patient Active Problem List   Diagnosis Date Noted  . HNP (herniated nucleus pulposus), lumbar 04/18/2013    Class: Diagnosis of  . BRCA1 genetic carrier 01/27/2013  . Breast cancer ([place] [facility]) 11/28/2007   Past Medical History:  Diagnosis Date  .  Anxiety   . Arthritis   . Asthma   . BRCA1 genetic carrier 01/27/2013  . Breast cancer ([place]) 2009   triple negative left breast  . Breast cancer ([facility]) 11/28/2007   Left breast stage T3N1, triple negative  . Depression   . GERD (gastroesophageal reflux disease)    watches what she eats  . Headache(784.0)    thinks due to prozac  . Hypertension    during pregnancy 2002  . Neuromuscular disorder ([place])    neuropathy legs started after chemo  . Pneumonia    2010, post surgery  . Pollen allergies   . Sarcoidosis   . Shortness of breath   . Sleep apnea       Family History  Problem Relation Age of Onset  . Diabetes Mother   . Hypertension Mother   . Hypertension Father   . Diabetes Father   . Hypertension Sister   . Cancer Paternal Aunt 97       breast cancer died at 59  . Cancer Paternal Uncle        lung cancer - 3 uncles  . Diabetes Paternal Grandmother   . Cancer Paternal Grandfather        lung cancer  . Cancer Cousin 30       breast cancer deceased at 51    Past Surgical History:  Procedure Laterality Date  . ABDOMINAL HYSTERECTOMY    . bilateral mastectomy Bilateral 2009   left sided breast cancer and prophylactic right  . BREAST SURGERY    . CESAREAN SECTION     x 2  . COLONOSCOPY  2010  . LUMBAR LAMINECTOMY Right 04/18/2013   Procedure: MICRODISCECTOMY LUMBAR LAMINECTOMY;  Surgeon: Marybelle Killings, MD;  Location: [facility];  Service: Orthopedics;  Laterality: Right;  Right L4-5 Microdiscectomy  . MASTECTOMY    . RECONSTRUCTION BREAST W/ TRAM FLAP Bilateral 2011   Done at [facility] History   Occupational History  . Not on file.   Social History Main Topics  . Smoking status: Never Smoker  . Smokeless tobacco: Never Used  . Alcohol use 0.6 oz/week    1 Glasses of wine per week     Comment: at holidays  . Drug use: No  . Sexual activity: Yes    Birth control/ protection: Condom

## 2017-11-02 DIAGNOSIS — Z23 Encounter for immunization: Secondary | ICD-10-CM | POA: Diagnosis not present

## 2017-11-02 DIAGNOSIS — Z1501 Genetic susceptibility to malignant neoplasm of breast: Secondary | ICD-10-CM | POA: Diagnosis not present

## 2017-11-02 DIAGNOSIS — R7303 Prediabetes: Secondary | ICD-10-CM | POA: Diagnosis not present

## 2017-11-02 DIAGNOSIS — I10 Essential (primary) hypertension: Secondary | ICD-10-CM | POA: Diagnosis not present

## 2017-11-02 DIAGNOSIS — Z853 Personal history of malignant neoplasm of breast: Secondary | ICD-10-CM | POA: Diagnosis not present

## 2017-11-02 DIAGNOSIS — Z923 Personal history of irradiation: Secondary | ICD-10-CM | POA: Diagnosis not present

## 2017-11-02 DIAGNOSIS — J4540 Moderate persistent asthma, uncomplicated: Secondary | ICD-10-CM | POA: Diagnosis not present

## 2017-11-02 DIAGNOSIS — Z1509 Genetic susceptibility to other malignant neoplasm: Secondary | ICD-10-CM | POA: Diagnosis not present

## 2017-11-02 DIAGNOSIS — E782 Mixed hyperlipidemia: Secondary | ICD-10-CM | POA: Diagnosis not present

## 2017-11-13 DIAGNOSIS — E782 Mixed hyperlipidemia: Secondary | ICD-10-CM | POA: Diagnosis not present

## 2017-11-13 DIAGNOSIS — K219 Gastro-esophageal reflux disease without esophagitis: Secondary | ICD-10-CM | POA: Diagnosis not present

## 2017-11-13 DIAGNOSIS — R079 Chest pain, unspecified: Secondary | ICD-10-CM | POA: Diagnosis not present

## 2017-11-13 DIAGNOSIS — E119 Type 2 diabetes mellitus without complications: Secondary | ICD-10-CM | POA: Diagnosis not present

## 2017-11-16 ENCOUNTER — Ambulatory Visit (INDEPENDENT_AMBULATORY_CARE_PROVIDER_SITE_OTHER): Admitting: Orthopaedic Surgery

## 2017-12-11 DIAGNOSIS — E782 Mixed hyperlipidemia: Secondary | ICD-10-CM | POA: Diagnosis not present

## 2017-12-11 DIAGNOSIS — I10 Essential (primary) hypertension: Secondary | ICD-10-CM | POA: Diagnosis not present

## 2017-12-11 DIAGNOSIS — W57XXXS Bitten or stung by nonvenomous insect and other nonvenomous arthropods, sequela: Secondary | ICD-10-CM | POA: Diagnosis not present

## 2017-12-11 DIAGNOSIS — R300 Dysuria: Secondary | ICD-10-CM | POA: Diagnosis not present

## 2017-12-11 DIAGNOSIS — Z5181 Encounter for therapeutic drug level monitoring: Secondary | ICD-10-CM | POA: Diagnosis not present

## 2017-12-11 DIAGNOSIS — K219 Gastro-esophageal reflux disease without esophagitis: Secondary | ICD-10-CM | POA: Diagnosis not present

## 2017-12-11 DIAGNOSIS — G5601 Carpal tunnel syndrome, right upper limb: Secondary | ICD-10-CM | POA: Diagnosis not present

## 2017-12-11 DIAGNOSIS — E119 Type 2 diabetes mellitus without complications: Secondary | ICD-10-CM | POA: Diagnosis not present

## 2017-12-13 DIAGNOSIS — R635 Abnormal weight gain: Secondary | ICD-10-CM | POA: Diagnosis not present

## 2018-01-02 DIAGNOSIS — M7711 Lateral epicondylitis, right elbow: Secondary | ICD-10-CM | POA: Diagnosis not present

## 2018-01-02 DIAGNOSIS — M25531 Pain in right wrist: Secondary | ICD-10-CM | POA: Diagnosis not present

## 2018-01-02 DIAGNOSIS — M654 Radial styloid tenosynovitis [de Quervain]: Secondary | ICD-10-CM | POA: Diagnosis not present

## 2018-01-02 DIAGNOSIS — Z9889 Other specified postprocedural states: Secondary | ICD-10-CM | POA: Diagnosis not present

## 2018-01-21 DIAGNOSIS — M654 Radial styloid tenosynovitis [de Quervain]: Secondary | ICD-10-CM | POA: Diagnosis not present

## 2018-02-26 DIAGNOSIS — R358 Other polyuria: Secondary | ICD-10-CM | POA: Diagnosis not present

## 2018-02-26 DIAGNOSIS — I10 Essential (primary) hypertension: Secondary | ICD-10-CM | POA: Diagnosis not present

## 2018-02-26 DIAGNOSIS — E119 Type 2 diabetes mellitus without complications: Secondary | ICD-10-CM | POA: Diagnosis not present

## 2018-02-26 DIAGNOSIS — E782 Mixed hyperlipidemia: Secondary | ICD-10-CM | POA: Diagnosis not present

## 2018-02-27 DIAGNOSIS — R635 Abnormal weight gain: Secondary | ICD-10-CM | POA: Diagnosis not present

## 2018-03-29 ENCOUNTER — Emergency Department (HOSPITAL_COMMUNITY)
Admission: EM | Admit: 2018-03-29 | Discharge: 2018-03-29 | Disposition: A | Discharge status: Home / Self Care | Payer: Federal, State, Local not specified - PPO | Attending: Emergency Medicine | Admitting: Emergency Medicine

## 2018-03-29 ENCOUNTER — Encounter (HOSPITAL_COMMUNITY): Payer: Self-pay | Admitting: Emergency Medicine

## 2018-03-29 DIAGNOSIS — M545 Low back pain, unspecified: Secondary | ICD-10-CM

## 2018-03-29 DIAGNOSIS — Z79899 Other long term (current) drug therapy: Secondary | ICD-10-CM | POA: Insufficient documentation

## 2018-03-29 DIAGNOSIS — Z853 Personal history of malignant neoplasm of breast: Secondary | ICD-10-CM | POA: Diagnosis not present

## 2018-03-29 MED ORDER — METHOCARBAMOL 500 MG PO TABS: 500.0000 mg | ORAL_TABLET | Freq: Two times a day (BID) | ORAL | 0 refills | Status: AC

## 2018-03-29 MED ORDER — PREDNISONE 10 MG PO TABS: ORAL_TABLET | ORAL | 0 refills | Status: DC

## 2018-03-29 NOTE — ED Provider Notes (Signed)
[facility] DEPT Provider Note   CSN: [id number] Arrival date & time: 03/29/18  1331     History   Chief Complaint Chief Complaint  Patient presents with  . Back Pain    HPI Cynthia Saunders is a 47 y.o. female.  The history is provided by the patient. No language interpreter was used.  Back Pain   This is a new problem. The problem has not changed since onset.The pain is associated with no known injury. The pain is present in the lumbar spine. The pain does not radiate. The pain is moderate. The pain is worse during the day. She has tried nothing for the symptoms. The treatment provided no relief.    Past Medical History:  Diagnosis Date  . Anxiety   . Arthritis   . Asthma   . BRCA1 genetic carrier 01/27/2013  . Breast cancer ([facility]) 2009   triple negative left breast  . Breast cancer ([facility]) 11/28/2007   Left breast stage T3N1, triple negative  . Depression   . GERD (gastroesophageal reflux disease)    watches what she eats  . Headache(784.0)    thinks due to prozac  . Hypertension    during pregnancy 2002  . Neuromuscular disorder ([facility])    neuropathy legs started after chemo  . Pneumonia    2010, post surgery  . Pollen allergies   . Sarcoidosis   . Shortness of breath   . Sleep apnea     Patient Active Problem List   Diagnosis Date Noted  . HNP (herniated nucleus pulposus), lumbar 04/18/2013    Class: Diagnosis of  . BRCA1 genetic carrier 01/27/2013  . Breast cancer ([facility]) 11/28/2007    Past Surgical History:  Procedure Laterality Date  . ABDOMINAL HYSTERECTOMY    . bilateral mastectomy Bilateral 2009   left sided breast cancer and prophylactic right  . BREAST SURGERY    . CESAREAN SECTION     x 2  . COLONOSCOPY  2010  . LUMBAR LAMINECTOMY Right 04/18/2013   Procedure: MICRODISCECTOMY LUMBAR LAMINECTOMY;  Surgeon: Marybelle Killings, MD;  Location: [facility];  Service: Orthopedics;  Laterality: Right;  Right L4-5 Microdiscectomy  .  MASTECTOMY    . RECONSTRUCTION BREAST W/ TRAM FLAP Bilateral 2011   Done at [facility]       OB History   None      Home Medications    Prior to Admission medications   Medication Sig Start Date End Date Taking? Authorizing Provider  albuterol (PROVENTIL HFA;VENTOLIN HFA) 108 (90 BASE) MCG/ACT inhaler Inhale 2 puffs into the lungs every 6 (six) hours as needed for wheezing.   Yes [provider]  ALPRAZolam Duanne Moron) 1 MG tablet Take 1 mg by mouth 3 (three) times daily as needed for anxiety.   Yes [provider]  beclomethasone (QVAR) 40 MCG/ACT inhaler Inhale 2 puffs into the lungs 2 (two) times daily.   Yes [provider]  lisinopril (PRINIVIL,ZESTRIL) 10 MG tablet Take 10 mg by mouth daily.   Yes [provider]  Menthol, Topical Analgesic, (BIOFREEZE EX) Apply 4 application topically daily as needed (lower back pain).   Yes [provider]  metFORMIN (GLUCOPHAGE) 500 MG tablet Take by mouth 2 (two) times daily with a meal.   Yes [provider]  Multiple Vitamins-Minerals (MULTIVITAMIN PO) Take 1 tablet by mouth daily.    Yes [provider]  Omega-3 1000 MG CAPS Take 1,000 mg  by mouth daily.   Yes [provider]  simvastatin (ZOCOR) 20 MG tablet Take 20 mg by mouth daily.   Yes [provider]  venlafaxine XR (EFFEXOR-XR) 150 MG 24 hr capsule Take 150 mg by mouth daily. 11/02/17  Yes [provider]  methocarbamol (ROBAXIN) 500 MG tablet Take 1 tablet (500 mg total) by mouth 2 (two) times daily. 03/29/18   Fransico Meadow, PA-C  predniSONE (DELTASONE) 10 MG tablet 6,5,4,3,2,1 taper 03/29/18   Fransico Meadow, PA-C  SYMBICORT 160-4.5 MCG/ACT inhaler Inhale 2 puffs into the lungs 2 (two) times daily.  02/19/13   [provider]    Family History Family History  Problem Relation Age of Onset  . Diabetes Mother   . Hypertension Mother   . Hypertension Father   .  Diabetes Father   . Hypertension Sister   . Cancer Paternal Aunt 62       breast cancer died at 47  . Cancer Paternal Uncle        lung cancer - 3 uncles  . Diabetes Paternal Grandmother   . Cancer Paternal Grandfather        lung cancer  . Cancer Cousin 30       breast cancer deceased at 14    Social History Social History   Tobacco Use  . Smoking status: Never Smoker  . Smokeless tobacco: Never Used  Substance Use Topics  . Alcohol use: Yes    Alcohol/week: 0.6 oz    Types: 1 Glasses of wine per week    Comment: at holidays  . Drug use: No     Allergies   Prozac [fluoxetine hcl]; Chlorhexidine; and Adhesive [tape]   Review of Systems Review of Systems  Musculoskeletal: Positive for back pain.  All other systems reviewed and are negative.    Physical Exam Updated Vital Signs BP 132/78 (BP Location: Right Arm)   Pulse 99   Temp 98.4 F (36.9 C) (Oral)   Resp 17   SpO2 99%   Physical Exam  Constitutional: She appears well-developed and well-nourished.  HENT:  Head: Normocephalic.  Cardiovascular: Normal rate.  Pulmonary/Chest: Effort normal.  Abdominal: Soft.  Musculoskeletal: Normal range of motion.  Tender lower lumbar spine,  Pain with range of motion,  From lower extremities   Neurological: She is alert.  Skin: Skin is warm.  Psychiatric: She has a normal mood and affect.  Nursing note and vitals reviewed.    ED Treatments / Results  Labs (all labs ordered are listed, but only abnormal results are displayed) Labs Reviewed - No data to display  EKG None  Radiology No results found.  Procedures Procedures (including critical care time)  Medications Ordered in ED Medications - No data to display   Initial Impression / Assessment and Plan / ED Course  I have reviewed the triage vital signs and the nursing notes.  Pertinent labs & imaging results that were available during my care of the patient were reviewed by me and considered in  my medical decision making (see chart for details).       Final Clinical Impressions(s) / ED Diagnoses   Final diagnoses:  Acute right-sided low back pain without sciatica    ED Discharge Orders        Ordered    predniSONE (DELTASONE) 10 MG tablet     03/29/18 1459    methocarbamol (ROBAXIN) 500 MG tablet  2 times daily     03/29/18 1459  Fransico Meadow, Hershal Coria 03/29/18 1717    Quintella Reichert, MD 03/30/18 [phone]

## 2018-03-29 NOTE — ED Triage Notes (Signed)
Patient c/o right lower back pain since initial injury in 2011. Denies urinary sx. Ambulatory.

## 2018-03-29 NOTE — Discharge Instructions (Signed)
See Dr. Lorin Mercy for recheck next week

## 2018-05-07 ENCOUNTER — Ambulatory Visit (INDEPENDENT_AMBULATORY_CARE_PROVIDER_SITE_OTHER): Payer: Self-pay

## 2018-05-07 ENCOUNTER — Encounter (INDEPENDENT_AMBULATORY_CARE_PROVIDER_SITE_OTHER): Payer: Self-pay | Admitting: Orthopaedic Surgery

## 2018-05-07 ENCOUNTER — Ambulatory Visit (INDEPENDENT_AMBULATORY_CARE_PROVIDER_SITE_OTHER): Admitting: Orthopaedic Surgery

## 2018-05-07 VITALS — BP 137/93 | HR 106 | Ht 67.5 in | Wt 236.0 lb

## 2018-05-07 DIAGNOSIS — M25562 Pain in left knee: Secondary | ICD-10-CM | POA: Diagnosis not present

## 2018-05-07 DIAGNOSIS — M545 Low back pain: Secondary | ICD-10-CM

## 2018-05-07 DIAGNOSIS — M25561 Pain in right knee: Secondary | ICD-10-CM | POA: Diagnosis not present

## 2018-05-07 DIAGNOSIS — F411 Generalized anxiety disorder: Secondary | ICD-10-CM | POA: Diagnosis not present

## 2018-05-07 DIAGNOSIS — E119 Type 2 diabetes mellitus without complications: Secondary | ICD-10-CM | POA: Diagnosis not present

## 2018-05-07 DIAGNOSIS — I10 Essential (primary) hypertension: Secondary | ICD-10-CM | POA: Diagnosis not present

## 2018-05-07 DIAGNOSIS — N9089 Other specified noninflammatory disorders of vulva and perineum: Secondary | ICD-10-CM | POA: Diagnosis not present

## 2018-05-07 DIAGNOSIS — M5136 Other intervertebral disc degeneration, lumbar region: Secondary | ICD-10-CM | POA: Insufficient documentation

## 2018-05-07 NOTE — Progress Notes (Signed)
Office Visit Note   Patient: Cynthia Saunders           Date of Birth: August 10, 1971           MRN: [id number] Visit Date: 05/07/2018              Requested by: Francee Piccolo, MD [place], [place] [address] PCP: Francee Piccolo, MD   Assessment & Plan: Visit Diagnoses:  1. Degenerative lumbar disc     Plan: I plan x-rays of her lumbar spine after exam today.  Patient states she has a 3 PM appointment at [facility] and currently has only 40 minutes for the one hour trip.  She can reschedule for x-rays at a later time.  Patient has not had her lumbar spine reimaged and has not had a repeat FCE.  At the current time there is no change in work recommendations which were in line with her previous FCE which is several years old.  Plan today was for AP lateral lumbar x-rays as well as lateral flexion-extension x-rays to check for abnormal motion.  She can obtain these on her return visit.  Follow-Up Instructions: No follow-ups on file.   Orders:  Orders Placed This Encounter  Procedures  . XR Lumbar Spine Complete W/Bend   No orders of the defined types were placed in this encounter.     Procedures: No procedures performed   Clinical Data: No additional findings.   Subjective: Chief Complaint  Patient presents with  . Lower Back - Pain  . Right Knee - Pain    HPI 47 year old female returns she had right L4-5 microdiscectomy April 18, 2013 related to [employer] injury for [employer].  In the interim since I last saw her she has had right first dorsal compartment release done at [facility] and also has had carpal tunnel release done in [place] ,[place].  Patient now was back in the [place] area.  She had FCE done after her surgery which limit her lifting to less than her normal job requirement.  Patient has not been back to work since her injury in 2011.  She is had the visits the emergency room was placed on prednisone also given some Robaxin with relief  she is had increased symptoms for the last 6 months.  She is been taking gabapentin.  Meloxicam was prescribed when I last saw her 04/17/2017.  Patient states she continues to have more pain on the right than left leg.  She has had some falls when she states she had back pain in her legs give way.  Last radiographs were in our office August 2014.  Patient has an attorney they have not offered her a position at modified duty in line with the FCE findings.  This is been going on for multiple years.  Review of Systems 14 point review of systems positive for previous microdiscectomy 2014 at L4-5.  Positive for diabetes on metformin.  History of breast cancer, right carpal tunnel release right first dorsal compartment release.  Otherwise negative as it pertains HPI.   Objective: Vital Signs: BP (!) 137/93   Pulse (!) 106   Ht 5' 7.5" (1.715 m)   Wt 236 lb (107 kg)   BMI 36.42 kg/m   Physical Exam  Constitutional: She is oriented to person, place, and time. She appears well-developed.  HENT:  Head: Normocephalic.  Right Ear: External ear normal.  Left Ear: External ear normal.  Eyes: Pupils are equal, round, and  reactive to light.  Neck: No tracheal deviation present. No thyromegaly present.  Cardiovascular: Normal rate.  Pulmonary/Chest: Effort normal.  Abdominal: Soft.  Neurological: She is alert and oriented to person, place, and time.  Skin: Skin is warm and dry.  Psychiatric: She has a normal mood and affect. Her behavior is normal.    Ortho Exam patient is able to toe walk when she tries to heel walk she is somewhat shaky.  Normal hip range of motion.  Mild sciatic notch tenderness.  Lumbar incisions well-healed.  No venous stasis changes distal pulses are palpable.  Knees reach full extension.  Slight prominence of the tibial tubercle on the right knee where she has a healed scar from previous fall recently.  Normal patellar tracking knee reach full extension.  Patella is normal to  palpation.  Specialty Comments:  No specialty comments available.  Imaging: No results found.   PMFS History: Patient Active Problem List   Diagnosis Date Noted  . Degenerative lumbar disc 05/07/2018  . HNP (herniated nucleus pulposus), lumbar 04/18/2013    Class: Diagnosis of  . BRCA1 genetic carrier 01/27/2013  . Breast cancer ([place]) 11/28/2007   Past Medical History:  Diagnosis Date  . Anxiety   . Arthritis   . Asthma   . BRCA1 genetic carrier 01/27/2013  . Breast cancer ([facility]) 2009   triple negative left breast  . Breast cancer ([facility]) 11/28/2007   Left breast stage T3N1, triple negative  . Depression   . GERD (gastroesophageal reflux disease)    watches what she eats  . Headache(784.0)    thinks due to prozac  . Hypertension    during pregnancy 2002  . Neuromuscular disorder ([facility])    neuropathy legs started after chemo  . Pneumonia    2010, post surgery  . Pollen allergies   . Sarcoidosis   . Shortness of breath   . Sleep apnea     Family History  Problem Relation Age of Onset  . Diabetes Mother   . Hypertension Mother   . Hypertension Father   . Diabetes Father   . Hypertension Sister   . Cancer Paternal Aunt 25       breast cancer died at 12  . Cancer Paternal Uncle        lung cancer - 3 uncles  . Diabetes Paternal Grandmother   . Cancer Paternal Grandfather        lung cancer  . Cancer Cousin 30       breast cancer deceased at 47    Past Surgical History:  Procedure Laterality Date  . ABDOMINAL HYSTERECTOMY    . bilateral mastectomy Bilateral 2009   left sided breast cancer and prophylactic right  . BREAST SURGERY    . CESAREAN SECTION     x 2  . COLONOSCOPY  2010  . LUMBAR LAMINECTOMY Right 04/18/2013   Procedure: MICRODISCECTOMY LUMBAR LAMINECTOMY;  Surgeon: Marybelle Killings, MD;  Location: [facility];  Service: Orthopedics;  Laterality: Right;  Right L4-5 Microdiscectomy  . MASTECTOMY    . RECONSTRUCTION BREAST W/ TRAM FLAP Bilateral 2011   Done at  [facility] History   Occupational History  . Not on file  Tobacco Use  . Smoking status: Never Smoker  . Smokeless tobacco: Never Used  Substance and Sexual Activity  . Alcohol use: Yes    Alcohol/week: 1.0 standard drinks    Types: 1 Glasses of wine per  week    Comment: at holidays  . Drug use: No  . Sexual activity: Yes    Birth control/protection: Condom

## 2018-05-08 ENCOUNTER — Ambulatory Visit (INDEPENDENT_AMBULATORY_CARE_PROVIDER_SITE_OTHER): Admitting: Orthopaedic Surgery

## 2018-05-09 ENCOUNTER — Telehealth (INDEPENDENT_AMBULATORY_CARE_PROVIDER_SITE_OTHER): Payer: Self-pay

## 2018-05-09 NOTE — Telephone Encounter (Signed)
Sent office note to dept of labor

## 2018-05-09 NOTE — Telephone Encounter (Signed)
-----   Message from Marybelle Killings, MD sent at 05/07/2018  2:59 PM EDT ----- Cc to W/C

## 2018-05-15 DIAGNOSIS — R51 Headache: Secondary | ICD-10-CM | POA: Diagnosis not present

## 2018-05-15 DIAGNOSIS — Z1501 Genetic susceptibility to malignant neoplasm of breast: Secondary | ICD-10-CM | POA: Diagnosis not present

## 2018-05-15 DIAGNOSIS — Z853 Personal history of malignant neoplasm of breast: Secondary | ICD-10-CM | POA: Diagnosis not present

## 2018-05-15 DIAGNOSIS — Z1509 Genetic susceptibility to other malignant neoplasm: Secondary | ICD-10-CM | POA: Diagnosis not present

## 2018-05-15 DIAGNOSIS — I10 Essential (primary) hypertension: Secondary | ICD-10-CM | POA: Diagnosis not present

## 2018-05-15 DIAGNOSIS — G4733 Obstructive sleep apnea (adult) (pediatric): Secondary | ICD-10-CM | POA: Diagnosis not present

## 2018-05-15 DIAGNOSIS — Z9989 Dependence on other enabling machines and devices: Secondary | ICD-10-CM | POA: Diagnosis not present

## 2018-05-21 ENCOUNTER — Ambulatory Visit (INDEPENDENT_AMBULATORY_CARE_PROVIDER_SITE_OTHER): Admitting: Orthopaedic Surgery

## 2018-05-21 ENCOUNTER — Ambulatory Visit (INDEPENDENT_AMBULATORY_CARE_PROVIDER_SITE_OTHER)

## 2018-05-21 ENCOUNTER — Encounter (INDEPENDENT_AMBULATORY_CARE_PROVIDER_SITE_OTHER): Payer: Self-pay | Admitting: Orthopaedic Surgery

## 2018-05-21 VITALS — BP 140/87 | HR 79 | Ht 67.5 in | Wt 236.0 lb

## 2018-05-21 DIAGNOSIS — M5136 Other intervertebral disc degeneration, lumbar region: Secondary | ICD-10-CM

## 2018-05-21 DIAGNOSIS — M7541 Impingement syndrome of right shoulder: Secondary | ICD-10-CM | POA: Diagnosis not present

## 2018-05-21 MED ORDER — BUPIVACAINE HCL 0.25 % IJ SOLN
4.0000 mL | INTRAMUSCULAR | Status: AC | PRN
  Administered 2018-05-21: 4 mL via INTRA_ARTICULAR

## 2018-05-21 MED ORDER — METHYLPREDNISOLONE ACETATE 40 MG/ML IJ SUSP
40.0000 mg | INTRAMUSCULAR | Status: AC | PRN
  Administered 2018-05-21: 40 mg via INTRA_ARTICULAR

## 2018-05-21 MED ORDER — LIDOCAINE HCL 1 % IJ SOLN
0.5000 mL | INTRAMUSCULAR | Status: AC | PRN
  Administered 2018-05-21: .5 mL

## 2018-05-21 NOTE — Progress Notes (Signed)
Office Visit Note   Patient: Cynthia Saunders           Date of Birth: March 19, 1971           MRN: [id number] Visit Date: 05/21/2018              Requested by: Francee Piccolo, MD [place], [place] [address] PCP: Francee Piccolo, MD   Assessment & Plan: Visit Diagnoses:  1. Degenerative lumbar disc   2. Impingement syndrome of right shoulder     Plan: Right subacromial injection performed for right shoulder impingement.  Concerning the letter from [place] [employer] her diagnosis is unchanged which is postop from L4-5 microdiscectomy without active radiculopathy.  She is on disability plus Worker's Comp. and has not had an FCE in several years.  Option would be ordering a new FCE for review to determine what her work capacity is.  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  This has not changed in the last 4 years.  No additional diagnostic studies or surgery is indicated at this time.  Follow-Up Instructions: No follow-ups on file.   Orders:  Orders Placed This Encounter  Procedures  . Large Joint Inj  . XR Lumbar Spine Complete   No orders of the defined types were placed in this encounter.     Procedures: Large Joint Inj: R subacromial bursa on 05/21/2018 11:18 AM Indications: pain Details: 22 G 1.5 in needle  Arthrogram: No  Medications: 4 mL bupivacaine 0.25 %; 40 mg methylPREDNISolone acetate 40 MG/ML; 0.5 mL lidocaine 1 % Outcome: tolerated well, no immediate complications Procedure, treatment alternatives, risks and benefits explained, specific risks discussed. Consent was given by the patient. Immediately prior to procedure a time out was called to verify the correct patient, procedure, equipment, support staff and site/side marked as required. Patient was prepped and draped in the usual sterile fashion.       Clinical Data: No additional findings.   Subjective: Chief Complaint  Patient presents with  . Lower  Back - Pain    HPI 47 year old female returns and has a form from the [place] [employer] with multiple questions.  Patient had right L4-5 microdiscectomy on 8/1 2014.  She used to work for [employer] which required some lifting of baggage minimal of 70 pounds.  Patient has not worked since her injury.  She states she is on disability plus Worker's Compensation.  She states she is having some problems with her right shoulder with outstretched reaching overhead activities.  This right shoulder showed a partial rotator cuff tear on previous MRI imaging several years ago.  She is also been diagnosed with breast cancer and mastectomy and reconstruction surgery.  Patient's been going to the gym to work on weight loss with her borderline diabetes and current metformin.  Some the activities may be aggravating her shoulder symptoms.  Review of Systems positive for previous L4-5 microdiscectomy right, breast cancer with reconstruction surgery, borderline diabetes on metformin.  History of right shoulder partial rotator cuff tear.  Objective: Vital Signs: BP 140/87   Pulse 79   Ht 5' 7.5" (1.715 m)   Wt 236 lb (107 kg)   BMI 36.42 kg/m   Physical Exam  Constitutional: She is oriented to person, place, and time. She appears well-developed.  HENT:  Head: Normocephalic.  Right Ear: External ear normal.  Left Ear: External ear normal.  Eyes: Pupils are equal, round, and reactive  to light.  Neck: No tracheal deviation present. No thyromegaly present.  Cardiovascular: Normal rate.  Pulmonary/Chest: Effort normal.  Abdominal: Soft.  Neurological: She is alert and oriented to person, place, and time.  Skin: Skin is warm and dry.  Psychiatric: She has a normal mood and affect. Her behavior is normal.    Ortho Exam negative drop arm test on the right no brachial plexus tenderness.  Negative Spurling.  Positive Neer test right negative on the left.  Upper extremity reflexes are 2+ and symmetrical biceps  triceps brachial radialis.  Well-healed lumbar incision.  Normal heel toe gait.  Specialty Comments:  No specialty comments available.  Imaging: Xr Lumbar Spine Complete  Result Date: 05/21/2018 AP lateral lumbar spine x-rays are obtained and reviewed.  This includes lateral flexion-extension views.  This shows previous right L4 laminotomy.  No spondylolisthesis and no motion on flexion-extension x-rays. Impression: Post op changes from right L4 laminotomy.  Facet degenerative changes noted without listhesis and no disc space narrowing.    PMFS History: Patient Active Problem List   Diagnosis Date Noted  . Impingement syndrome of right shoulder 05/21/2018  . Degenerative lumbar disc 05/07/2018  . HNP (herniated nucleus pulposus), lumbar 04/18/2013    Class: Diagnosis of  . BRCA1 genetic carrier 01/27/2013  . Breast cancer ([facility]) 11/28/2007   Past Medical History:  Diagnosis Date  . Anxiety   . Arthritis   . Asthma   . BRCA1 genetic carrier 01/27/2013  . Breast cancer ([facility]) 2009   triple negative left breast  . Breast cancer ([facility]) 11/28/2007   Left breast stage T3N1, triple negative  . Depression   . GERD (gastroesophageal reflux disease)    watches what she eats  . Headache(784.0)    thinks due to prozac  . Hypertension    during pregnancy 2002  . Neuromuscular disorder ([place])    neuropathy legs started after chemo  . Pneumonia    2010, post surgery  . Pollen allergies   . Sarcoidosis   . Shortness of breath   . Sleep apnea     Family History  Problem Relation Age of Onset  . Diabetes Mother   . Hypertension Mother   . Hypertension Father   . Diabetes Father   . Hypertension Sister   . Cancer Paternal Aunt 60       breast cancer died at 7  . Cancer Paternal Uncle        lung cancer - 3 uncles  . Diabetes Paternal Grandmother   . Cancer Paternal Grandfather        lung cancer  . Cancer Cousin 30       breast cancer deceased at 24    Past Surgical History:    Procedure Laterality Date  . ABDOMINAL HYSTERECTOMY    . bilateral mastectomy Bilateral 2009   left sided breast cancer and prophylactic right  . BREAST SURGERY    . CESAREAN SECTION     x 2  . COLONOSCOPY  2010  . LUMBAR LAMINECTOMY Right 04/18/2013   Procedure: MICRODISCECTOMY LUMBAR LAMINECTOMY;  Surgeon: Marybelle Killings, MD;  Location: [facility];  Service: Orthopedics;  Laterality: Right;  Right L4-5 Microdiscectomy  . MASTECTOMY    . RECONSTRUCTION BREAST W/ TRAM FLAP Bilateral 2011   Done at [facility] History   Occupational History  . Not on file  Tobacco Use  . Smoking status: Never Smoker  . Smokeless  tobacco: Never Used  Substance and Sexual Activity  . Alcohol use: Yes    Alcohol/week: 1.0 standard drinks    Types: 1 Glasses of wine per week    Comment: at holidays  . Drug use: No  . Sexual activity: Yes    Birth control/protection: Condom

## 2018-06-05 ENCOUNTER — Ambulatory Visit (INDEPENDENT_AMBULATORY_CARE_PROVIDER_SITE_OTHER): Admitting: Orthopaedic Surgery

## 2018-06-21 DIAGNOSIS — I10 Essential (primary) hypertension: Secondary | ICD-10-CM | POA: Diagnosis not present

## 2018-06-21 DIAGNOSIS — Z1211 Encounter for screening for malignant neoplasm of colon: Secondary | ICD-10-CM | POA: Diagnosis not present

## 2018-06-21 DIAGNOSIS — Z853 Personal history of malignant neoplasm of breast: Secondary | ICD-10-CM | POA: Diagnosis not present

## 2018-06-21 DIAGNOSIS — Z1509 Genetic susceptibility to other malignant neoplasm: Secondary | ICD-10-CM | POA: Diagnosis not present

## 2018-06-21 DIAGNOSIS — G43009 Migraine without aura, not intractable, without status migrainosus: Secondary | ICD-10-CM | POA: Diagnosis not present

## 2018-06-21 DIAGNOSIS — Z1501 Genetic susceptibility to malignant neoplasm of breast: Secondary | ICD-10-CM | POA: Diagnosis not present

## 2018-06-21 DIAGNOSIS — E119 Type 2 diabetes mellitus without complications: Secondary | ICD-10-CM | POA: Diagnosis not present

## 2018-06-21 DIAGNOSIS — Z79899 Other long term (current) drug therapy: Secondary | ICD-10-CM | POA: Diagnosis not present

## 2018-06-26 DIAGNOSIS — Z79899 Other long term (current) drug therapy: Secondary | ICD-10-CM | POA: Diagnosis not present

## 2018-06-26 DIAGNOSIS — I517 Cardiomegaly: Secondary | ICD-10-CM | POA: Diagnosis not present

## 2018-07-11 DIAGNOSIS — Z853 Personal history of malignant neoplasm of breast: Secondary | ICD-10-CM | POA: Diagnosis not present

## 2018-07-11 DIAGNOSIS — Z0000 Encounter for general adult medical examination without abnormal findings: Secondary | ICD-10-CM | POA: Diagnosis not present

## 2018-07-11 DIAGNOSIS — G4733 Obstructive sleep apnea (adult) (pediatric): Secondary | ICD-10-CM | POA: Diagnosis not present

## 2018-07-11 DIAGNOSIS — E119 Type 2 diabetes mellitus without complications: Secondary | ICD-10-CM | POA: Diagnosis not present

## 2018-07-11 DIAGNOSIS — Z008 Encounter for other general examination: Secondary | ICD-10-CM | POA: Diagnosis not present

## 2018-07-11 DIAGNOSIS — Z6837 Body mass index (BMI) 37.0-37.9, adult: Secondary | ICD-10-CM | POA: Diagnosis not present

## 2018-07-11 DIAGNOSIS — Z90710 Acquired absence of both cervix and uterus: Secondary | ICD-10-CM | POA: Diagnosis not present

## 2018-07-11 DIAGNOSIS — F4310 Post-traumatic stress disorder, unspecified: Secondary | ICD-10-CM | POA: Diagnosis not present

## 2018-07-26 ENCOUNTER — Other Ambulatory Visit: Payer: Self-pay | Admitting: Nurse Practitioner

## 2018-07-26 ENCOUNTER — Other Ambulatory Visit (HOSPITAL_COMMUNITY): Payer: Self-pay | Admitting: Nurse Practitioner

## 2018-07-26 DIAGNOSIS — R1031 Right lower quadrant pain: Secondary | ICD-10-CM

## 2018-07-26 DIAGNOSIS — K219 Gastro-esophageal reflux disease without esophagitis: Secondary | ICD-10-CM | POA: Diagnosis not present

## 2018-07-26 DIAGNOSIS — K5909 Other constipation: Secondary | ICD-10-CM | POA: Diagnosis not present

## 2018-07-26 DIAGNOSIS — E119 Type 2 diabetes mellitus without complications: Secondary | ICD-10-CM | POA: Diagnosis not present

## 2018-07-26 DIAGNOSIS — Z8371 Family history of colonic polyps: Secondary | ICD-10-CM | POA: Diagnosis not present

## 2018-07-30 ENCOUNTER — Ambulatory Visit (HOSPITAL_COMMUNITY): Admission: RE | Admit: 2018-07-30 | Payer: Federal, State, Local not specified - PPO | Source: Ambulatory Visit

## 2018-08-07 ENCOUNTER — Ambulatory Visit (HOSPITAL_COMMUNITY)
Admission: RE | Admit: 2018-08-07 | Discharge: 2018-08-07 | Disposition: A | Discharge status: Home / Self Care | Payer: Federal, State, Local not specified - PPO | Source: Ambulatory Visit | Attending: Nurse Practitioner | Admitting: Nurse Practitioner

## 2018-08-07 ENCOUNTER — Encounter (HOSPITAL_COMMUNITY): Payer: Self-pay

## 2018-08-07 DIAGNOSIS — R1031 Right lower quadrant pain: Secondary | ICD-10-CM | POA: Diagnosis not present

## 2018-08-07 DIAGNOSIS — Z853 Personal history of malignant neoplasm of breast: Secondary | ICD-10-CM | POA: Diagnosis not present

## 2018-08-07 DIAGNOSIS — N200 Calculus of kidney: Secondary | ICD-10-CM | POA: Diagnosis not present

## 2018-08-07 MED ORDER — SODIUM CHLORIDE (PF) 0.9 % IJ SOLN
INTRAMUSCULAR | Status: AC
  Filled 2018-08-07: qty 50

## 2018-08-07 MED ORDER — IOHEXOL 300 MG/ML  SOLN
100.0000 mL | Freq: Once | INTRAMUSCULAR | Status: AC | PRN
  Administered 2018-08-07: 100 mL via INTRAVENOUS

## 2018-08-13 ENCOUNTER — Other Ambulatory Visit: Payer: Self-pay | Admitting: Internal Medicine

## 2018-08-13 DIAGNOSIS — Z23 Encounter for immunization: Secondary | ICD-10-CM | POA: Diagnosis not present

## 2018-08-13 DIAGNOSIS — G4452 New daily persistent headache (NDPH): Secondary | ICD-10-CM

## 2018-08-13 DIAGNOSIS — F320 Major depressive disorder, single episode, mild: Secondary | ICD-10-CM | POA: Diagnosis not present

## 2018-08-13 DIAGNOSIS — Z0001 Encounter for general adult medical examination with abnormal findings: Secondary | ICD-10-CM | POA: Diagnosis not present

## 2018-08-13 DIAGNOSIS — I509 Heart failure, unspecified: Secondary | ICD-10-CM | POA: Diagnosis not present

## 2018-08-13 DIAGNOSIS — E119 Type 2 diabetes mellitus without complications: Secondary | ICD-10-CM | POA: Diagnosis not present

## 2018-08-13 DIAGNOSIS — Z6837 Body mass index (BMI) 37.0-37.9, adult: Secondary | ICD-10-CM | POA: Diagnosis not present

## 2018-08-13 DIAGNOSIS — R51 Headache: Secondary | ICD-10-CM | POA: Diagnosis not present

## 2018-08-13 DIAGNOSIS — Z0189 Encounter for other specified special examinations: Secondary | ICD-10-CM | POA: Diagnosis not present

## 2018-08-25 ENCOUNTER — Other Ambulatory Visit: Payer: Self-pay

## 2018-08-25 ENCOUNTER — Ambulatory Visit
Admission: RE | Admit: 2018-08-25 | Discharge: 2018-08-25 | Disposition: A | Discharge status: Home / Self Care | Payer: Federal, State, Local not specified - PPO | Source: Ambulatory Visit | Attending: Internal Medicine | Admitting: Internal Medicine

## 2018-08-25 DIAGNOSIS — G4452 New daily persistent headache (NDPH): Secondary | ICD-10-CM

## 2018-08-25 DIAGNOSIS — R51 Headache: Secondary | ICD-10-CM | POA: Diagnosis not present

## 2018-08-27 DIAGNOSIS — E119 Type 2 diabetes mellitus without complications: Secondary | ICD-10-CM | POA: Diagnosis not present

## 2018-08-27 DIAGNOSIS — E1142 Type 2 diabetes mellitus with diabetic polyneuropathy: Secondary | ICD-10-CM | POA: Diagnosis not present

## 2018-08-27 DIAGNOSIS — Z008 Encounter for other general examination: Secondary | ICD-10-CM | POA: Diagnosis not present

## 2018-08-27 DIAGNOSIS — E785 Hyperlipidemia, unspecified: Secondary | ICD-10-CM | POA: Diagnosis not present

## 2018-08-27 DIAGNOSIS — I10 Essential (primary) hypertension: Secondary | ICD-10-CM | POA: Diagnosis not present

## 2018-08-27 DIAGNOSIS — Z79899 Other long term (current) drug therapy: Secondary | ICD-10-CM | POA: Diagnosis not present

## 2018-08-27 DIAGNOSIS — R51 Headache: Secondary | ICD-10-CM | POA: Diagnosis not present

## 2018-08-27 DIAGNOSIS — Z6838 Body mass index (BMI) 38.0-38.9, adult: Secondary | ICD-10-CM | POA: Diagnosis not present

## 2018-08-27 DIAGNOSIS — F330 Major depressive disorder, recurrent, mild: Secondary | ICD-10-CM | POA: Diagnosis not present

## 2018-09-06 DIAGNOSIS — M25561 Pain in right knee: Secondary | ICD-10-CM | POA: Diagnosis not present

## 2018-09-06 DIAGNOSIS — Z008 Encounter for other general examination: Secondary | ICD-10-CM | POA: Diagnosis not present

## 2018-09-30 ENCOUNTER — Ambulatory Visit
Admission: RE | Admit: 2018-09-30 | Payer: Federal, State, Local not specified - PPO | Source: Home / Self Care | Admitting: Unknown Physician Specialty

## 2018-09-30 ENCOUNTER — Encounter: Admission: RE | Payer: Self-pay | Source: Home / Self Care

## 2018-09-30 SURGERY — EGD (ESOPHAGOGASTRODUODENOSCOPY)
Anesthesia: General

## 2018-11-15 ENCOUNTER — Encounter (INDEPENDENT_AMBULATORY_CARE_PROVIDER_SITE_OTHER): Payer: Self-pay | Admitting: Orthopaedic Surgery

## 2018-11-15 ENCOUNTER — Ambulatory Visit (INDEPENDENT_AMBULATORY_CARE_PROVIDER_SITE_OTHER): Payer: Medicare HMO | Admitting: Orthopaedic Surgery

## 2018-11-15 VITALS — BP 137/89 | HR 80 | Ht 67.5 in | Wt 240.0 lb

## 2018-11-15 DIAGNOSIS — Z9889 Other specified postprocedural states: Secondary | ICD-10-CM | POA: Diagnosis not present

## 2018-11-15 NOTE — Progress Notes (Signed)
Office Visit Note   Patient: Cynthia Saunders           Date of Birth: 1971-02-13           MRN: [id number] Visit Date: 11/15/2018              Requested by: No referring provider defined for this encounter. PCP: System, Pcp Not In   Assessment & Plan: Visit Diagnoses:  1. Status post lumbar microdiscectomy     Plan: Patient is having radicular pain down the top of her foot dorsally.  We will send in some prednisone 5 mg Dosepak.  She will watch her sugars.  We discussed potential for hyperglycemia.  We discussed potential for possible repeat MRI with and without contrast to rule out recurrent disc protrusion if her symptoms persist.  At this time she has intact motor function and hopefully her symptoms will settle down with the prednisone.  Follow-Up Instructions: Return if symptoms worsen or fail to improve.   Orders:  No orders of the defined types were placed in this encounter.  No orders of the defined types were placed in this encounter.     Procedures: No procedures performed   Clinical Data: No additional findings.   Subjective: Chief Complaint  Patient presents with  . Lower Back - Pain    HPI patient returns states she stepped off a step and suddenly had sharp pain in her back and into her right leg down to her knee with numbness and tingling.  She been taking gabapentin and states that did not give her any relief.  She states she was getting off the porch when she stepped down.  She states her pain is about 7 out of 10.  She is on [employer] also disability and is planning to take summer classes in the fall.  She has had problems going to sitting standing pain with daily activities.  Previous L4-5 microdiscectomy 04/18/2013.  This is related to original on-the-job injury.  Review of Systems reviewed updated of note is positive for diabetes and she is on metformin otherwise 14 point review of systems noncontributory as a pertains HPI.   Objective: Vital  Signs: BP 137/89   Pulse 80   Ht 5' 7.5" (1.715 m)   Wt 240 lb (108.9 kg)   BMI 37.03 kg/m   Physical Exam Constitutional:      Appearance: She is well-developed.  HENT:     Head: Normocephalic.     Right Ear: External ear normal.     Left Ear: External ear normal.  Eyes:     Pupils: Pupils are equal, round, and reactive to light.  Neck:     Thyroid: No thyromegaly.     Trachea: No tracheal deviation.  Cardiovascular:     Rate and Rhythm: Normal rate.  Pulmonary:     Effort: Pulmonary effort is normal.  Abdominal:     Palpations: Abdomen is soft.  Skin:    General: Skin is warm and dry.  Neurological:     Mental Status: She is alert and oriented to person, place, and time.  Psychiatric:        Behavior: Behavior normal.     Ortho Exam patient is able to heel and toe walk lumbar incision is well-healed.  No weakness anterior tib gastrocsoleus no atrophy.  No rash over exposed skin.  Specialty Comments:  No specialty comments available.  Imaging: No results found.   PMFS History: Patient Active Problem List  Diagnosis Date Noted  . Impingement syndrome of right shoulder 05/21/2018  . Degenerative lumbar disc 05/07/2018  . HNP (herniated nucleus pulposus), lumbar 04/18/2013    Class: Diagnosis of  . BRCA1 genetic carrier 01/27/2013  . Breast cancer ([facility]) 11/28/2007   Past Medical History:  Diagnosis Date  . Anxiety   . Arthritis   . Asthma   . BRCA1 genetic carrier 01/27/2013  . Breast cancer ([facility]) 2009   triple negative left breast  . Breast cancer ([facility]) 11/28/2007   Left breast stage T3N1, triple negative  . Depression   . GERD (gastroesophageal reflux disease)    watches what she eats  . Headache(784.0)    thinks due to prozac  . Hypertension    during pregnancy 2002  . Neuromuscular disorder ([facility])    neuropathy legs started after chemo  . Pneumonia    2010, post surgery  . Pollen allergies   . Sarcoidosis   . Shortness of breath   . Sleep  apnea     Family History  Problem Relation Age of Onset  . Diabetes Mother   . Hypertension Mother   . Hypertension Father   . Diabetes Father   . Hypertension Sister   . Cancer Paternal Aunt 55       breast cancer died at 61  . Cancer Paternal Uncle        lung cancer - 3 uncles  . Diabetes Paternal Grandmother   . Cancer Paternal Grandfather        lung cancer  . Cancer Cousin 30       breast cancer deceased at 64    Past Surgical History:  Procedure Laterality Date  . ABDOMINAL HYSTERECTOMY    . bilateral mastectomy Bilateral 2009   left sided breast cancer and prophylactic right  . BREAST SURGERY    . CESAREAN SECTION     x 2  . COLONOSCOPY  2010  . LUMBAR LAMINECTOMY Right 04/18/2013   Procedure: MICRODISCECTOMY LUMBAR LAMINECTOMY;  Surgeon: Marybelle Killings, MD;  Location: [facility];  Service: Orthopedics;  Laterality: Right;  Right L4-5 Microdiscectomy  . MASTECTOMY    . RECONSTRUCTION BREAST W/ TRAM FLAP Bilateral 2011   Done at [facility] History   Occupational History  . Not on file  Tobacco Use  . Smoking status: Never Smoker  . Smokeless tobacco: Never Used  Substance and Sexual Activity  . Alcohol use: Yes    Alcohol/week: 1.0 standard drinks    Types: 1 Glasses of wine per week    Comment: at holidays  . Drug use: No  . Sexual activity: Yes    Birth control/protection: Condom

## 2019-04-22 ENCOUNTER — Encounter: Payer: Self-pay | Admitting: Orthopaedic Surgery

## 2019-04-22 ENCOUNTER — Ambulatory Visit (INDEPENDENT_AMBULATORY_CARE_PROVIDER_SITE_OTHER): Admitting: Orthopaedic Surgery

## 2019-04-22 ENCOUNTER — Other Ambulatory Visit: Payer: Self-pay

## 2019-04-22 VITALS — Ht 67.5 in | Wt 242.0 lb

## 2019-04-22 DIAGNOSIS — Z9889 Other specified postprocedural states: Secondary | ICD-10-CM

## 2019-04-22 NOTE — Progress Notes (Addendum)
Office Visit Note   Patient: Cynthia Saunders           Date of Birth: December 16, 1970           MRN: [id number] Visit Date: 04/22/2019              Requested by: No referring provider defined for this encounter. PCP: System, Pcp Not In   Assessment & Plan: Visit Diagnoses:  1. Status post lumbar microdiscectomy     Plan: Patient has had a previous FCE.  This outlined her job capacity.  She has been through vocal rehab.  There is no change in her status.  No additional diagnostic imaging is indicated.  Patient is on disability and is still getting Worker's Comp. her injury date was 2008.  She states she is going to school in the fall which is been mentioned in my previous notes over the last multiple years.  She states she continues to look for a job.  A new FCE could be obtained but that would not her status.  Previous FCE was valid.  She continues to complain of problems when she is standing for a period of time does better when she is changing positions.  Patient brought in a form.  It will be filled out identical to last year's form.  Follow-Up Instructions: No follow-ups on file.   Orders:  No orders of the defined types were placed in this encounter.  No orders of the defined types were placed in this encounter.     Procedures: No procedures performed   Clinical Data: No additional findings.   Subjective: Chief Complaint  Patient presents with  . Lower Back - Pain, Follow-up    04/18/13 L4-5 Microdiscectomy  OTJI 01/10/2007    HPI 48 year old female returns for follow-up of on-the-job injury while working for [employer] on 01/10/2007.  This is for a yearly checkup she had an left L4-5 microdiscectomy 04/18/2013 and states she has some continued problems with some numbness in her right leg.  Some problems with her right knee as well particularly with stairs.  She is not been back to work since her injury and she states she signed up for classes in the fall taking associate of the arts  and communication.  Patient does have diabetes she still on metformin.  Previously she is tried anti-inflammatories she is used some Voltaren gel with relief.  Neurontin was not effective.  Review of Systems unchanged from 11/15/2018 office visit.   Objective: Vital Signs: Ht 5' 7.5" (1.715 m)   Wt 242 lb (109.8 kg)   BMI 37.34 kg/m   Physical Exam  Ortho Exam patient is able to heel and toe walk.  Negative straight leg raising 90 degrees.  Knee jerks 2+ and symmetrical ankle jerk 1+ and symmetrical.  Anterior tib gastrocsoleus is strong.  Specialty Comments:  No specialty comments available.  Imaging: No results found.   PMFS History: Patient Active Problem List   Diagnosis Date Noted  . Status post lumbar microdiscectomy 11/15/2018  . Impingement syndrome of right shoulder 05/21/2018  . Degenerative lumbar disc 05/07/2018  . HNP (herniated nucleus pulposus), lumbar 04/18/2013    Class: Diagnosis of  . BRCA1 genetic carrier 01/27/2013  . Breast cancer ([facility]) 11/28/2007   Past Medical History:  Diagnosis Date  . Anxiety   . Arthritis   . Asthma   . BRCA1 genetic carrier 01/27/2013  . Breast cancer ([facility]) 2009   triple negative left breast  . Breast  cancer ([facility]) 11/28/2007   Left breast stage T3N1, triple negative  . Depression   . GERD (gastroesophageal reflux disease)    watches what she eats  . Headache(784.0)    thinks due to prozac  . Hypertension    during pregnancy 2002  . Neuromuscular disorder ([facility])    neuropathy legs started after chemo  . Pneumonia    2010, post surgery  . Pollen allergies   . Sarcoidosis   . Shortness of breath   . Sleep apnea     Family History  Problem Relation Age of Onset  . Diabetes Mother   . Hypertension Mother   . Hypertension Father   . Diabetes Father   . Hypertension Sister   . Cancer Paternal Aunt 51       breast cancer died at 48  . Cancer Paternal Uncle        lung cancer - 3 uncles  . Diabetes Paternal  Grandmother   . Cancer Paternal Grandfather        lung cancer  . Cancer Cousin 30       breast cancer deceased at 44    Past Surgical History:  Procedure Laterality Date  . ABDOMINAL HYSTERECTOMY    . bilateral mastectomy Bilateral 2009   left sided breast cancer and prophylactic right  . BREAST SURGERY    . CESAREAN SECTION     x 2  . COLONOSCOPY  2010  . LUMBAR LAMINECTOMY Right 04/18/2013   Procedure: MICRODISCECTOMY LUMBAR LAMINECTOMY;  Surgeon: Marybelle Killings, MD;  Location: [facility];  Service: Orthopedics;  Laterality: Right;  Right L4-5 Microdiscectomy  . MASTECTOMY    . RECONSTRUCTION BREAST W/ TRAM FLAP Bilateral 2011   Done at [facility] History   Occupational History  . Not on file  Tobacco Use  . Smoking status: Never Smoker  . Smokeless tobacco: Never Used  Substance and Sexual Activity  . Alcohol use: Yes    Alcohol/week: 1.0 standard drinks    Types: 1 Glasses of wine per week    Comment: at holidays  . Drug use: No  . Sexual activity: Yes    Birth control/protection: Condom

## 2019-09-03 ENCOUNTER — Other Ambulatory Visit: Payer: Self-pay

## 2019-09-03 ENCOUNTER — Encounter: Payer: Self-pay | Admitting: Orthopaedic Surgery

## 2019-09-03 ENCOUNTER — Ambulatory Visit (INDEPENDENT_AMBULATORY_CARE_PROVIDER_SITE_OTHER): Payer: Federal, State, Local not specified - PPO | Admitting: Orthopaedic Surgery

## 2019-09-03 ENCOUNTER — Ambulatory Visit (INDEPENDENT_AMBULATORY_CARE_PROVIDER_SITE_OTHER): Payer: Federal, State, Local not specified - PPO

## 2019-09-03 VITALS — BP 133/93 | HR 89 | Ht 67.5 in | Wt 245.0 lb

## 2019-09-03 DIAGNOSIS — M545 Low back pain, unspecified: Secondary | ICD-10-CM

## 2019-09-03 MED ORDER — PREDNISONE 10 MG PO TABS: ORAL_TABLET | ORAL | 0 refills | Status: DC

## 2019-09-03 NOTE — Progress Notes (Signed)
Office Visit Note   Patient: Cynthia Saunders           Date of Birth: May 22, 1971           MRN: [id number] Visit Date: 09/03/2019              Requested by: No referring provider defined for this encounter. PCP: System, Pcp Not In   Assessment & Plan: Visit Diagnoses:  1. Acute bilateral low back pain, unspecified whether sciatica present     Plan: Prednisone pack prescribed.  She has been going to water aerobics she does not swim but does water aerobics with hand weights in the pool 3 times a week.  We discussed walking 2 miles a day and then incorporating some other activities in the gym to help with weight loss.  I recommend she avoid lunges, squats, military press.  She should avoid heavy weights on the machines that she could do light activities with quads hamstrings leg presses on machines all with lighter weights.  She has about physical therapy.  I recommend prednisone Dosepak which should take care of her symptoms if she has persistent symptoms we can consider physical therapy referral.  She has had intermittent symptoms with her back and came in this time since her symptoms were more severe than normal.  Prednisone pack should take care of her problem and then changes as mentioned above should help with some gradual weight loss which will help unload her back as well.  Follow-Up Instructions: Return if symptoms worsen or fail to improve.   Orders:  Orders Placed This Encounter  Procedures  . XR Lumbar Spine 2-3 Views   Meds ordered this encounter  Medications  . predniSONE (DELTASONE) 10 MG tablet    Sig: 6,5,4,3,2,1 taper    Dispense:  21 tablet    Refill:  0      Procedures: No procedures performed   Clinical Data: No additional findings.   Subjective: Chief Complaint  Patient presents with  . Lower Back - Pain    HPI 48 year old female states she woke up last week with increased back pain worse on the left than right that radiated down to her knees and  some tingling in her toes.  Previous microdiscectomy lumbar L4-5 level in August 2014.  She was working for [employer] this was a workers comp injury.  She is now in school.  She states she had great difficulty moving and was at the grocery store turned and twisted and states she could hardly walk.  She finally made it back home.  She states she has been on gabapentin for years but it does not really help anymore.  She uses some Voltaren cream called them by her PCP.  No associated bowel or bladder symptoms.  Review of Systems 14 point update unchanged from 05/21/2018 office visit.  Borderline diabetes A1c less than 6.  Past history of right shoulder partial rotator cuff tear.   Objective: Vital Signs: BP (!) 133/93   Pulse 89   Ht 5' 7.5" (1.715 m)   Wt 245 lb (111.1 kg)   BMI 37.81 kg/m   Physical Exam Constitutional:      Appearance: She is well-developed.  HENT:     Head: Normocephalic.     Right Ear: External ear normal.     Left Ear: External ear normal.  Eyes:     Pupils: Pupils are equal, round, and reactive to light.  Neck:     Thyroid: No thyromegaly.  Trachea: No tracheal deviation.  Cardiovascular:     Rate and Rhythm: Normal rate.  Pulmonary:     Effort: Pulmonary effort is normal.  Abdominal:     Palpations: Abdomen is soft.  Skin:    General: Skin is warm and dry.  Neurological:     Mental Status: She is alert and oriented to person, place, and time.  Psychiatric:        Behavior: Behavior normal.     Ortho Exam patient gets from sitting to standing slow deliberate gait.  Negative Trendelenburg.  Negative logroll to the hips negative straight leg raising 90 degrees negative popliteal compression test.  Well-healed lumbar incision.  Some sciatic notch tenderness right and left.  No quad weakness.  Knee jerk 2+ left ankle jerk 2+ right ankle jerk trace.  Anterior tib gastrocsoleus are strong no atrophy.  Intact distal pulses.  Specialty Comments:  No specialty  comments available.  Imaging: XR Lumbar Spine 2-3 Views  Result Date: 09/03/2019 AP lateral lumbar spine x-rays obtained and reviewed.  This shows maintenance of L4-5 disc space height no spondylolisthesis.  Negative for acute changes.  AP x-ray demonstrates previous L4 laminotomy. Impression: Lumbar x-rays negative for acute changes.  No listhesis noted.    PMFS History: Patient Active Problem List   Diagnosis Date Noted  . Status post lumbar microdiscectomy 11/15/2018  . Impingement syndrome of right shoulder 05/21/2018  . Degenerative lumbar disc 05/07/2018  . HNP (herniated nucleus pulposus), lumbar 04/18/2013    Class: Diagnosis of  . BRCA1 genetic carrier 01/27/2013  . Breast cancer ([facility]) 11/28/2007   Past Medical History:  Diagnosis Date  . Anxiety   . Arthritis   . Asthma   . BRCA1 genetic carrier 01/27/2013  . Breast cancer ([facility]) 2009   triple negative left breast  . Breast cancer ([facility]) 11/28/2007   Left breast stage T3N1, triple negative  . Depression   . GERD (gastroesophageal reflux disease)    watches what she eats  . Headache(784.0)    thinks due to prozac  . Hypertension    during pregnancy 2002  . Neuromuscular disorder ([facility])    neuropathy legs started after chemo  . Pneumonia    2010, post surgery  . Pollen allergies   . Sarcoidosis   . Shortness of breath   . Sleep apnea     Family History  Problem Relation Age of Onset  . Diabetes Mother   . Hypertension Mother   . Hypertension Father   . Diabetes Father   . Hypertension Sister   . Cancer Paternal Aunt 44       breast cancer died at 45  . Cancer Paternal Uncle        lung cancer - 3 uncles  . Diabetes Paternal Grandmother   . Cancer Paternal Grandfather        lung cancer  . Cancer Cousin 30       breast cancer deceased at 70    Past Surgical History:  Procedure Laterality Date  . ABDOMINAL HYSTERECTOMY    . bilateral mastectomy Bilateral 2009   left sided breast cancer and  prophylactic right  . BREAST SURGERY    . CESAREAN SECTION     x 2  . COLONOSCOPY  2010  . LUMBAR LAMINECTOMY Right 04/18/2013   Procedure: MICRODISCECTOMY LUMBAR LAMINECTOMY;  Surgeon: Marybelle Killings, MD;  Location: [facility];  Service: Orthopedics;  Laterality: Right;  Right L4-5 Microdiscectomy  . MASTECTOMY    .  RECONSTRUCTION BREAST W/ TRAM FLAP Bilateral 2011   Done at [facility] History   Occupational History  . Not on file  Tobacco Use  . Smoking status: Never Smoker  . Smokeless tobacco: Never Used  Substance and Sexual Activity  . Alcohol use: Yes    Alcohol/week: 1.0 standard drinks    Types: 1 Glasses of wine per week    Comment: at holidays  . Drug use: No  . Sexual activity: Yes    Birth control/protection: Condom

## 2020-03-02 ENCOUNTER — Encounter: Payer: Self-pay | Admitting: Orthopaedic Surgery

## 2020-03-02 ENCOUNTER — Ambulatory Visit (INDEPENDENT_AMBULATORY_CARE_PROVIDER_SITE_OTHER): Payer: Self-pay | Admitting: Orthopaedic Surgery

## 2020-03-02 VITALS — Ht 67.5 in | Wt 240.0 lb

## 2020-03-02 DIAGNOSIS — Z9889 Other specified postprocedural states: Secondary | ICD-10-CM

## 2020-03-02 DIAGNOSIS — M5136 Other intervertebral disc degeneration, lumbar region: Secondary | ICD-10-CM

## 2020-03-02 NOTE — Addendum Note (Signed)
Addended by: Meyer Cory on: 03/02/2020 04:32 PM   Modules accepted: Orders

## 2020-03-02 NOTE — Progress Notes (Signed)
Office Visit Note   Patient: Cynthia Saunders           Date of Birth: March 17, 1971           MRN: [id number] Visit Date: 03/02/2020              Requested by: No referring provider defined for this encounter. PCP: System, Pcp Not In   Assessment & Plan: Visit Diagnoses:  1. Degenerative lumbar disc   2. Status post lumbar microdiscectomy     Plan: Patient states that she has been on disability and wants to know her exact restrictions.  When she was working out I had recommended she avoid certain activities in the gym and these recommendations continue.  She requested a copy of those recommendations we made a copy of the last office visit so that she has them available.  She asked about work employment options and I discussed with her activities that would be less likely to bother her back.  I recommend a new MRI scan with and without contrast since she is not had imaging studies and at the least 6 or 7 years.  Lumbar MRI scan with and without contrast.  Office follow-up after scan for review.  Follow-Up Instructions: return after lumbar MRI with and without contrast.   Orders:  No orders of the defined types were placed in this encounter.  No orders of the defined types were placed in this encounter.     Procedures: No procedures performed   Clinical Data: No additional findings.   Subjective: Chief Complaint  Patient presents with  . Lower Back - Pain    04/18/2013 L4-5 Microdiscectomy   OTJI 01/10/2007    HPI 49 year old female returns in have not seen her since 09/03/2019.  She had previous lumbar microdiscectomy August 2014 on the right at L4-5 with microdiscectomy.  She states she has not worked since injury.  She is finished her degree and states she is now looking for employment options.  [facility] rehabilitation had asked her about restrictions.  Patient states she hurts after she works out and on last visit I had recommended she work on walking 2 miles a day working on  [employer] loss and avoid lunging squats [employer] press activities and just do light weight activities on the machines.  Patient states she continues to have pain to some degree at all times but with increased activities pain gets worse she is not had any imaging studies since 2014.  Patient used to work for [employer].  Review of Systems patient had some borderline glucose measurements A1c was less than 6 in the past.  Past history of partial rotator cuff tear right shoulder.  Patient had breast cancer diagnosed 2009 without recurrence.   Objective: Vital Signs: Ht 5' 7.5" (1.715 m)   Wt 240 lb (108.9 kg)   BMI 37.03 kg/m   Physical Exam Constitutional:      Appearance: She is well-developed.  HENT:     Head: Normocephalic.     Right Ear: External ear normal.     Left Ear: External ear normal.  Eyes:     Pupils: Pupils are equal, round, and reactive to light.  Neck:     Thyroid: No thyromegaly.     Trachea: No tracheal deviation.  Cardiovascular:     Rate and Rhythm: Normal rate.  Pulmonary:     Effort: Pulmonary effort is normal.  Abdominal:     Palpations: Abdomen is soft.  Skin:  General: Skin is warm and dry.  Neurological:     Mental Status: She is alert and oriented to person, place, and time.  Psychiatric:        Behavior: Behavior normal.     Ortho Exam patient has intact knee and ankle jerk.  Anterior tib EHL is intact negative logroll the hips.  Lumbar incision L4-5 well-healed no cellulitis.  She has some mild sciatic notch tenderness some discomfort with straight leg raising 90 degrees with popliteal compression test on the right and left.  Normal heel toe gait.  Good quad strength.  Specialty Comments:  No specialty comments available.  Imaging: No results found.   PMFS History: Patient Active Problem List   Diagnosis Date Noted  . Status post lumbar microdiscectomy 11/15/2018  . Impingement syndrome of right shoulder 05/21/2018  . Degenerative lumbar disc  05/07/2018  . HNP (herniated nucleus pulposus), lumbar 04/18/2013    Class: Diagnosis of  . BRCA1 genetic carrier 01/27/2013  . Breast cancer ([facility]) 11/28/2007   Past Medical History:  Diagnosis Date  . Anxiety   . Arthritis   . Asthma   . BRCA1 genetic carrier 01/27/2013  . Breast cancer ([facility]) 2009   triple negative left breast  . Breast cancer ([facility]) 11/28/2007   Left breast stage T3N1, triple negative  . Depression   . GERD (gastroesophageal reflux disease)    watches what she eats  . Headache(784.0)    thinks due to prozac  . Hypertension    during pregnancy 2002  . Neuromuscular disorder ([facility])    neuropathy legs started after chemo  . Pneumonia    2010, post surgery  . Pollen allergies   . Sarcoidosis   . Shortness of breath   . Sleep apnea     Family History  Problem Relation Age of Onset  . Diabetes Mother   . Hypertension Mother   . Hypertension Father   . Diabetes Father   . Hypertension Sister   . Cancer Paternal Aunt 61       breast cancer died at 73  . Cancer Paternal Uncle        lung cancer - 3 uncles  . Diabetes Paternal Grandmother   . Cancer Paternal Grandfather        lung cancer  . Cancer Cousin 30       breast cancer deceased at 60    Past Surgical History:  Procedure Laterality Date  . ABDOMINAL HYSTERECTOMY    . bilateral mastectomy Bilateral 2009   left sided breast cancer and prophylactic right  . BREAST SURGERY    . CESAREAN SECTION     x 2  . COLONOSCOPY  2010  . LUMBAR LAMINECTOMY Right 04/18/2013   Procedure: MICRODISCECTOMY LUMBAR LAMINECTOMY;  Surgeon: Marybelle Killings, MD;  Location: [facility];  Service: Orthopedics;  Laterality: Right;  Right L4-5 Microdiscectomy  . MASTECTOMY    . RECONSTRUCTION BREAST W/ TRAM FLAP Bilateral 2011   Done at [facility] History   Occupational History  . Not on file  Tobacco Use  . Smoking status: Never Smoker  . Smokeless tobacco: Never Used  Substance and Sexual  Activity  . Alcohol use: Yes    Alcohol/week: 1.0 standard drink    Types: 1 Glasses of wine per week    Comment: at holidays  . Drug use: No  . Sexual activity: Yes    Birth control/protection: Condom

## 2020-03-10 ENCOUNTER — Ambulatory Visit: Payer: Medicare HMO | Admitting: Orthopaedic Surgery

## 2020-03-24 ENCOUNTER — Ambulatory Visit: Payer: Self-pay

## 2020-03-24 ENCOUNTER — Encounter: Payer: Self-pay | Admitting: Orthopaedic Surgery

## 2020-03-24 ENCOUNTER — Ambulatory Visit (INDEPENDENT_AMBULATORY_CARE_PROVIDER_SITE_OTHER): Payer: Federal, State, Local not specified - PPO | Admitting: Orthopaedic Surgery

## 2020-03-24 VITALS — Ht 67.5 in | Wt 245.0 lb

## 2020-03-24 DIAGNOSIS — M5136 Other intervertebral disc degeneration, lumbar region: Secondary | ICD-10-CM

## 2020-03-24 DIAGNOSIS — M25562 Pain in left knee: Secondary | ICD-10-CM

## 2020-03-24 DIAGNOSIS — M25531 Pain in right wrist: Secondary | ICD-10-CM

## 2020-03-24 DIAGNOSIS — Z9889 Other specified postprocedural states: Secondary | ICD-10-CM

## 2020-03-24 DIAGNOSIS — G8929 Other chronic pain: Secondary | ICD-10-CM

## 2020-03-24 NOTE — Progress Notes (Signed)
Office Visit Note   Patient: Cynthia Saunders           Date of Birth: 14-Feb-1971           MRN: [id number] Visit Date: 03/24/2020              Requested by: No referring provider defined for this encounter. PCP: System, Pcp Not In   Assessment & Plan: Visit Diagnoses:  1. Pain in right wrist   2. Chronic pain of left knee   3. Degenerative lumbar disc   4. Status post lumbar microdiscectomy     Plan: Right wrist splint is applied and use discussed which is intermittant.  Right wrist she can use it intermittently.  We discussed modifying some of the workout activities she has been doing at the gym to decrease the stress on her wrist.  We discussed several quad exercises such as wall squats straight leg raising using her purse over her ankle as a counterweight.  She can return once lumbar MRI scan is approved and she will bring copy of the disc.   Follow-Up Instructions: No follow-ups on file.   Orders:  Orders Placed This Encounter  Procedures  . XR KNEE 3 VIEW LEFT  . XR Wrist Complete Right   No orders of the defined types were placed in this encounter.     Procedures: No procedures performed   Clinical Data: No additional findings.   Subjective: Chief Complaint  Patient presents with  . Right Wrist - Pain  . Left Knee - Pain    HPI 48 year old female who is waiting on approval for MRI scan lumbar with and without contrast related to [employer] injury returns early and states she has been having wrist pain for 6 months.  Had previous first dorsal compartment release done in [place] and also transverse incision at the wrist which looks like a endoscopic carpal tunnel release scar as well.  She has been working out for the last 6 months and states her wrist has been sore painful she is right-hand dominant.  Wrist surgeries were done in [facility] 2016.  Patient states she is also had left knee pain ongoing for about 9 months some  problems with stairs.  She has had some popping in her knees occasionally has felt like it wants to give way and she has had one episode where it did give way.  Review of Systems all other systems are noncontributory and updated from 09/03/19 office  note   Objective: Vital Signs: Ht 5' 7.5" (1.715 m)   Wt 245 lb (111.1 kg)   BMI 37.81 kg/m   Physical Exam Constitutional:      Appearance: She is well-developed.  HENT:     Head: Normocephalic.     Right Ear: External ear normal.     Left Ear: External ear normal.  Eyes:     Pupils: Pupils are equal, round, and reactive to light.  Neck:     Thyroid: No thyromegaly.     Trachea: No tracheal deviation.  Cardiovascular:     Rate and Rhythm: Normal rate.  Pulmonary:     Effort: Pulmonary effort is normal.  Abdominal:     Palpations: Abdomen is soft.  Skin:    General: Skin is warm and dry.  Neurological:     Mental Status: She is alert and oriented to person, place, and time.  Psychiatric:        Behavior: Behavior normal.  Ortho Exam normal patient has some discomfort with resisted wrist flexion and extension.  Transverse incision noted over the first dorsal compartment and also transverse incision at the distal wrist crease.  No thenar atrophy negative Phalen's negative Tinel's.  Ulnar nerve at the elbow is normal.  She has some discomfort in the forearm with gripping.  No snuffbox tenderness tuberosity scaphoid is normal.  Minimal crepitus with knee flexion extension.  She has some quad weakness and flexes at the hips to go up on a step both right and left.  Negative logroll the hips anterior tib gastrocsoleus is strong.  Lumbar incisions well-healed.  Specialty Comments:  No specialty comments available.  Imaging: No results found.   PMFS History: Patient Active Problem List   Diagnosis Date Noted  . Status post lumbar microdiscectomy 11/15/2018  . Impingement syndrome of right shoulder 05/21/2018  .  Degenerative lumbar disc 05/07/2018  . HNP (herniated nucleus pulposus), lumbar 04/18/2013    Class: Diagnosis of  . BRCA1 genetic carrier 01/27/2013  . Breast cancer ([facility]) 11/28/2007   Past Medical History:  Diagnosis Date  . Anxiety   . Arthritis   . Asthma   . BRCA1 genetic carrier 01/27/2013  . Breast cancer ([facility]) 2009   triple negative left breast  . Breast cancer ([facility]) 11/28/2007   Left breast stage T3N1, triple negative  . Depression   . GERD (gastroesophageal reflux disease)    watches what she eats  . Headache(784.0)    thinks due to prozac  . Hypertension    during pregnancy 2002  . Neuromuscular disorder ([facility])    neuropathy legs started after chemo  . Pneumonia    2010, post surgery  . Pollen allergies   . Sarcoidosis   . Shortness of breath   . Sleep apnea     Family History  Problem Relation Age of Onset  . Diabetes Mother   . Hypertension Mother   . Hypertension Father   . Diabetes Father   . Hypertension Sister   . Cancer Paternal Aunt 67       breast cancer died at 86  . Cancer Paternal Uncle        lung cancer - 3 uncles  . Diabetes Paternal Grandmother   . Cancer Paternal Grandfather        lung cancer  . Cancer Cousin 30       breast cancer deceased at 42    Past Surgical History:  Procedure Laterality Date  . ABDOMINAL HYSTERECTOMY    . bilateral mastectomy Bilateral 2009   left sided breast cancer and prophylactic right  . BREAST SURGERY    . CESAREAN SECTION     x 2  . COLONOSCOPY  2010  . LUMBAR LAMINECTOMY Right 04/18/2013   Procedure: MICRODISCECTOMY LUMBAR LAMINECTOMY;  Surgeon: Marybelle Killings, MD;  Location: [facility];  Service: Orthopedics;  Laterality: Right;  Right L4-5 Microdiscectomy  . MASTECTOMY    . RECONSTRUCTION BREAST W/ TRAM FLAP Bilateral 2011   Done at [facility] History   Occupational History  . Not on file  Tobacco Use  . Smoking status: Never Smoker  . Smokeless tobacco: Never Used   Substance and Sexual Activity  . Alcohol use: Yes    Alcohol/week: 1.0 standard drink    Types: 1 Glasses of wine per week    Comment: at holidays  . Drug use: No  . Sexual activity: Yes  Birth control/protection: Condom

## 2020-10-13 ENCOUNTER — Other Ambulatory Visit: Payer: Self-pay | Admitting: General Practice

## 2020-10-17 NOTE — Progress Notes (Deleted)
Cardiology Office Note:    Date:  10/17/2020   ID:  Cynthia Saunders, DOB [birth year], MRN [id number]  PCP:  Andree Moro, DO  Cardiologist:  No primary care provider on file.  Electrophysiologist:  None   Referring MD: Andree Moro, DO   No chief complaint on file. ***  History of Present Illness:    Cynthia Saunders is a 50 y.o. female with a hx of breast cancer, hypertension, sarcoidosis, OSA who is referred by Dr. Posey Pronto for evaluation of hypertension, hyperlipidemia, and possible CHF.  Echocardiogram at [facility] on 06/26/18 showed normal biventricular function, normal diastolic dysfunction, no significant valvular disease.  Past Medical History:  Diagnosis Date  . Anxiety   . Arthritis   . Asthma   . BRCA1 genetic carrier 01/27/2013  . Breast cancer ([facility]) 2009   triple negative left breast  . Breast cancer ([facility]) 11/28/2007   Left breast stage T3N1, triple negative  . Depression   . GERD (gastroesophageal reflux disease)    watches what she eats  . Headache(784.0)    thinks due to prozac  . Hypertension    during pregnancy 2002  . Neuromuscular disorder ([facility])    neuropathy legs started after chemo  . Pneumonia    2010, post surgery  . Pollen allergies   . Sarcoidosis   . Shortness of breath   . Sleep apnea     Past Surgical History:  Procedure Laterality Date  . ABDOMINAL HYSTERECTOMY    . bilateral mastectomy Bilateral 2009   left sided breast cancer and prophylactic right  . BREAST SURGERY    . CESAREAN SECTION     x 2  . COLONOSCOPY  2010  . LUMBAR LAMINECTOMY Right 04/18/2013   Procedure: MICRODISCECTOMY LUMBAR LAMINECTOMY;  Surgeon: Marybelle Killings, MD;  Location: [facility];  Service: Orthopedics;  Laterality: Right;  Right L4-5 Microdiscectomy  . MASTECTOMY    . RECONSTRUCTION BREAST W/ TRAM FLAP Bilateral 2011   Done at [facility]      Current Medications: No outpatient medications have been marked as taking for the 10/18/20 encounter  (Appointment) with Donato Heinz, MD.     Allergies:   Prozac [fluoxetine hcl], Chlorhexidine, and Adhesive [tape]   Social History   Socioeconomic History  . Marital status: Single    Spouse name: Not on file  . Number of children: Not on file  . Years of education: Not on file  . Highest education level: Not on file  Occupational History  . Not on file  Tobacco Use  . Smoking status: Never Smoker  . Smokeless tobacco: Never Used  Substance and Sexual Activity  . Alcohol use: Yes    Alcohol/week: 1.0 standard drink    Types: 1 Glasses of wine per week    Comment: at holidays  . Drug use: No  . Sexual activity: Yes    Birth control/protection: Condom  Other Topics Concern  . Not on file  Social History Narrative  . Not on file   Social Determinants of Health   Financial Resource Strain: Not on file  Food Insecurity: Not on file  Transportation Needs: Not on file  Physical Activity: Not on file  Stress: Not on file  Social Connections: Not on file     Family History: The patient's ***family history includes Cancer in her paternal grandfather and paternal uncle; Cancer (age of onset: 30) in her cousin; Cancer (age of onset: 66) in her paternal  aunt; Diabetes in her father, mother, and paternal grandmother; Hypertension in her father, mother, and sister.  ROS:   Please see the history of present illness.    *** All other systems reviewed and are negative.  EKGs/Labs/Other Studies Reviewed:    The following studies were reviewed today: ***  EKG:  EKG is *** ordered today.  The ekg ordered today demonstrates ***  Recent Labs: No results found for requested labs within last 8760 hours.  Recent Lipid Panel No results found for: CHOL, TRIG, HDL, CHOLHDL, VLDL, LDLCALC, LDLDIRECT  Physical Exam:    VS:  There were no vitals taken for this visit.    Wt Readings from Last 3 Encounters:  03/24/20 245 lb (111.1 kg)  03/02/20 240 lb (108.9 kg)  09/03/19  245 lb (111.1 kg)     GEN: *** Well nourished, well developed in no acute distress HEENT: Normal NECK: No JVD; No carotid bruits LYMPHATICS: No lymphadenopathy CARDIAC: ***RRR, no murmurs, rubs, gallops RESPIRATORY:  Clear to auscultation without rales, wheezing or rhonchi  ABDOMEN: Soft, non-tender, non-distended MUSCULOSKELETAL:  No edema; No deformity  SKIN: Warm and dry NEUROLOGIC:  Alert and oriented x 3 PSYCHIATRIC:  Normal affect   ASSESSMENT:    No diagnosis found. PLAN:    In order of problems listed above:  1. ***   Medication Adjustments/Labs and Tests Ordered: Current medicines are reviewed at length with the patient today.  Concerns regarding medicines are outlined above.  No orders of the defined types were placed in this encounter.  No orders of the defined types were placed in this encounter.   There are no Patient Instructions on file for this visit.   Signed, Donato Heinz, MD  10/17/2020 8:54 PM    [facility]

## 2020-10-18 ENCOUNTER — Ambulatory Visit: Payer: Medicare HMO | Admitting: Cardiology

## 2020-10-22 ENCOUNTER — Other Ambulatory Visit: Payer: Self-pay | Admitting: General Practice

## 2020-10-22 DIAGNOSIS — R109 Unspecified abdominal pain: Secondary | ICD-10-CM

## 2020-10-25 ENCOUNTER — Other Ambulatory Visit: Payer: Self-pay

## 2020-10-25 ENCOUNTER — Ambulatory Visit (INDEPENDENT_AMBULATORY_CARE_PROVIDER_SITE_OTHER): Payer: Federal, State, Local not specified - PPO | Admitting: Internal Medicine

## 2020-10-25 ENCOUNTER — Encounter: Payer: Self-pay | Admitting: Internal Medicine

## 2020-10-25 VITALS — BP 126/82 | HR 78 | Ht 67.5 in | Wt 244.0 lb

## 2020-10-25 DIAGNOSIS — C50912 Malignant neoplasm of unspecified site of left female breast: Secondary | ICD-10-CM

## 2020-10-25 DIAGNOSIS — Z171 Estrogen receptor negative status [ER-]: Secondary | ICD-10-CM

## 2020-10-25 DIAGNOSIS — D869 Sarcoidosis, unspecified: Secondary | ICD-10-CM | POA: Diagnosis not present

## 2020-10-25 DIAGNOSIS — R072 Precordial pain: Secondary | ICD-10-CM | POA: Diagnosis not present

## 2020-10-25 MED ORDER — METOPROLOL TARTRATE 50 MG PO TABS: ORAL_TABLET | ORAL | 0 refills | Status: DC

## 2020-10-25 NOTE — Progress Notes (Signed)
Cardiology Office Note:    Date:  10/25/2020   ID:  Cynthia Saunders, DOB 10/27/1970, MRN [id number]  PCP:  Rayburn Felt, MD  [facility] Cardiologist:  No primary care provider on file.  [facility] Electrophysiologist:  None   CC: Anxious Consulted for the evaluation of chest pain and SOB at the behest of Rayburn Felt, MD  History of Present Illness:    Cynthia Saunders is a 50 y.o. female with a hx of GERD, Sarcoidosis prior biopsy positive in [place] negative as of coming to [place], breast cancer, Gestational HTN and pre-eclamspia and , agoraphobia with no issues with imaging in the past who presents for evaluation 10/25/20.  Oncological History notable for: Malignancies: Left Breast Cancer (Triple Negative) Surgery: Bilateral Mastectomy 2009 Chemotherapy: Adriamycin and Cytoxan x4 cycles followed by Taxol  Cessations for Toxicity: None Radiation: Unknown total dose of radiation done with the heart in the field of view (2009)  Oncology care spearheaded by: [facility] [phone] (Fax)  Patient notes that she is feeling chest pain at many different times.  Feels sharp pain in the center of her chest with radiation that radiates arm.  Also notes left lower abdominal pain.  Discomfort occurs with activity, worsens with with doing chores, and improves with rest (does note resolve).  Patient exertion notable for working with kids  and feels occasional  symptoms.  Notes somes shortness of breath, but notes it more with her anxiety.  No PND or orthopnea.  No bendopnea, weight gain, leg swelling , or abdominal swelling.  No syncope or near syncope . Notes  no palpitations or funny heart beats.      Past Medical History:  Diagnosis Date  . Anxiety   . Arthritis   . Asthma   . BRCA1 genetic carrier 01/27/2013  . Breast cancer ([place]) 2009   triple negative left breast  . Breast cancer ([place]) 11/28/2007   Left breast stage T3N1, triple negative  . Depression   . GERD  (gastroesophageal reflux disease)    watches what she eats  . Headache(784.0)    thinks due to prozac  . Hypertension    during pregnancy 2002  . Neuromuscular disorder ([place])    neuropathy legs started after chemo  . Pneumonia    2010, post surgery  . Pollen allergies   . Sarcoidosis   . Shortness of breath   . Sleep apnea     Past Surgical History:  Procedure Laterality Date  . ABDOMINAL HYSTERECTOMY    . bilateral mastectomy Bilateral 2009   left sided breast cancer and prophylactic right  . BREAST SURGERY    . CESAREAN SECTION     x 2  . COLONOSCOPY  2010  . LUMBAR LAMINECTOMY Right 04/18/2013   Procedure: MICRODISCECTOMY LUMBAR LAMINECTOMY;  Surgeon: Marybelle Killings, MD;  Location: [place];  Service: Orthopedics;  Laterality: Right;  Right L4-5 Microdiscectomy  . MASTECTOMY    . RECONSTRUCTION BREAST W/ TRAM FLAP Bilateral 2011   Done at [facility]  . TUBAL LIGATION      Current Medications: Current Meds  Medication Sig  . albuterol (PROVENTIL HFA;VENTOLIN HFA) 108 (90 BASE) MCG/ACT inhaler Inhale 2 puffs into the lungs every 6 (six) hours as needed for wheezing.  Marland Kitchen ALPRAZolam (XANAX) 1 MG tablet Take 1 mg by mouth 3 (three) times daily as needed for anxiety.  Marland Kitchen amitriptyline (ELAVIL) 25 MG tablet Take by mouth.  . beclomethasone (QVAR) 40 MCG/ACT inhaler  Inhale 2 puffs into the lungs 2 (two) times daily.  Marland Kitchen desvenlafaxine (PRISTIQ) 100 MG 24 hr tablet Take 1 tablet by mouth daily.  Marland Kitchen lisinopril (PRINIVIL,ZESTRIL) 10 MG tablet Take 10 mg by mouth daily.  . Menthol, Topical Analgesic, (BIOFREEZE EX) Apply 4 application topically daily as needed (lower back pain).  . metFORMIN (GLUCOPHAGE) 500 MG tablet Take by mouth 2 (two) times daily with a meal.  . methocarbamol (ROBAXIN) 500 MG tablet Take 1 tablet (500 mg total) by mouth 2 (two) times daily.  . metoprolol tartrate (LOPRESSOR) 50 MG tablet Take 2 hours prior to Cardiac CT  . Multiple Vitamins-Minerals (MULTIVITAMIN  PO) Take 1 tablet by mouth daily.   . Omega-3 1000 MG CAPS Take 1,000 mg by mouth daily.  . pantoprazole (PROTONIX) 40 MG tablet Take 40 mg by mouth daily.  . simvastatin (ZOCOR) 20 MG tablet Take 20 mg by mouth daily.  . SYMBICORT 160-4.5 MCG/ACT inhaler Inhale 2 puffs into the lungs 2 (two) times daily.   . [DISCONTINUED] predniSONE (DELTASONE) 10 MG tablet 6,5,4,3,2,1 taper     Allergies:   Prozac [fluoxetine hcl], Chlorhexidine, and Adhesive [tape]   Social History   Socioeconomic History  . Marital status: Single    Spouse name: Not on file  . Number of children: Not on file  . Years of education: Not on file  . Highest education level: Not on file  Occupational History  . Not on file  Tobacco Use  . Smoking status: Never Smoker  . Smokeless tobacco: Never Used  Substance and Sexual Activity  . Alcohol use: Yes    Alcohol/week: 1.0 standard drink    Types: 1 Glasses of wine per week    Comment: at holidays  . Drug use: No  . Sexual activity: Yes    Birth control/protection: Condom  Other Topics Concern  . Not on file  Social History Narrative  . Not on file   Social Determinants of Health   Financial Resource Strain: Not on file  Food Insecurity: Not on file  Transportation Needs: Not on file  Physical Activity: Not on file  Stress: Not on file  Social Connections: Not on file     Family History: The patient's family history includes Cancer in her paternal grandfather and paternal uncle; Cancer (age of onset: 59) in her cousin; Cancer (age of onset: 24) in her paternal aunt; Diabetes in her father, mother, and paternal grandmother; Hypertension in her father, mother, and sister. History of coronary artery disease notable for no members. History of heart failure notable for no members. History of arrhythmia notable for no members.  ROS:   Please see the history of present illness.     All other systems reviewed and are negative.  EKGs/Labs/Other Studies  Reviewed:    The following studies were reviewed today:  EKG:   10/25/2020: sinus rhythm rate 78 WNL  NonCardiac CT: Date: 10/25/2020 Personally reviewed Results: Breath artifact noted; no aortic atherosclerosis, coronary artery calcification, or aortic dilation   Transthoracic Echocardiogram: Date: 06/26/2018 [facility] Echo Report Results:  NORMAL LEFT VENTRICULAR SYSTOLICFUNCTION WITH MILD LVH   NORMAL LA PRESSURES WITH NORMAL DIASTOLIC FUNCTION   NORMAL RIGHT VENTRICULAR SYSTOLIC FUNCTION   VALVULAR REGURGITATION: TRIVIAL MR, TRIVIAL PR, TRIVIAL TR   NO VALVULAR STENOSIS   NO PRIOR STUDY FOR COMPARISON   3D acquisition and reconstructions were performed as part of this   examination to more accurately quantify the effects of Pre/Post Chemo,  Radiation or other therapy. (post-processing on an Independent   workstation).    Recent Labs: No results found for requested labs within last 8760 hours.  Recent Lipid Panel No results found for: CHOL, TRIG, HDL, CHOLHDL, VLDL, LDLCALC, LDLDIRECT   Risk Assessment/Calculations:     N/A  Physical Exam:    VS:  BP 126/82   Pulse 78   Ht 5' 7.5" (1.715 m)   Wt 244 lb (110.7 kg)   BMI 37.65 kg/m     Wt Readings from Last 3 Encounters:  10/25/20 244 lb (110.7 kg)  03/24/20 245 lb (111.1 kg)  03/02/20 240 lb (108.9 kg)     GEN: Obese, well developed in no acute distress HEENT: Normal NECK: No JVD; No carotid bruits LYMPHATICS: No lymphadenopathy CARDIAC: RRR, no murmurs, rubs, gallops RESPIRATORY:  Clear to auscultation without rales, wheezing or rhonchi  ABDOMEN: Soft, non-tender, non-distended MUSCULOSKELETAL:  No edema; No deformity  SKIN: Warm and dry NEUROLOGIC:  Alert and oriented x 3 PSYCHIATRIC:  Normal affect   ASSESSMENT:    1. Precordial chest pain   2. Precordial pain   3. Sarcoidosis    PLAN:    In order of problems listed above:  Chest Pain Syndrome Precordial chest pain DOE concerning for  angina equivalent  - The patient presents with possible cardiac pain - EKG  without evidence of accessory pathway, ventricular pacing, digoxin use, LBBB, or baseline ST changes. - elevated risk with gestation HTN, pre-eclampsia, and prior radiation - Additional Blood Work:  Lipids  At next visit - Will do BNP and BMP - Would recommend CCTA with possible FFR as needed to exclude obstructive CAD and to assess for non-obstructive CAD requiring secondary prevention  Prior Malignancy with Cardiotoxic Chemotherapy - LV Function including GLS or other Strain, 3D Eval, MAPSE, or CMR: OK in the past, given this and sarcoidosis will get echocardiogram - with prior cardiotoxic chemotherapy and radiation - Encouraged exercise on a regular basis and healthy dietary habits  Cardiac Sarcoidosis Eval -With extracardiac sarcoidosis - with biopsy - without palpitations , presyncope, or syncope. - without  complete left or right bundle branch block; presence of unexplained pathologic Q waves in two or more leads; sustained first-, second-, or third-degree AV block; or sustained or nonsustained VT (will get Loop read) - without  regional wall motion abnormality, ventricular aneurysm, basal septal thinning, or depressed LVEF on prior echo - will defer cardiac imaging  Will send records to Dr. Renard Matter, [place] [phone] (Fax)    Medication Adjustments/Labs and Tests Ordered: Current medicines are reviewed at length with the patient today.  Concerns regarding medicines are outlined above.  Orders Placed This Encounter  Procedures  . CT CORONARY MORPH W/CTA COR W/SCORE W/CA W/CM &/OR WO/CM  . CT CORONARY FRACTIONAL FLOW RESERVE DATA PREP  . CT CORONARY FRACTIONAL FLOW RESERVE FLUID ANALYSIS  . Pro b natriuretic peptide (BNP)  . Basic metabolic panel  . Basic metabolic panel  . EKG 12-Lead  . ECHOCARDIOGRAM COMPLETE   Meds ordered this encounter  Medications  . metoprolol tartrate (LOPRESSOR) 50  MG tablet    Sig: Take 2 hours prior to Cardiac CT    Dispense:  1 tablet    Refill:  0    Patient Instructions  Medication Instructions:  Your physician recommends that you continue on your current medications as directed. Please refer to the Current Medication list given to you today.  *If you need a refill on your  cardiac medications before your next appointment, please call your pharmacy*   Lab Work: TODAY: BNP, BMP If you have labs (blood work) drawn today and your tests are completely normal, you will receive your results only by: Marland Kitchen MyChart Message (if you have MyChart) OR . A paper copy in the mail If you have any lab test that is abnormal or we need to change your treatment, we will call you to review the results.   Testing/Procedures: Your physician has requested that you have an echocardiogram. Echocardiography is a painless test that uses sound waves to create images of your heart. It provides your doctor with information about the size and shape of your heart and how well your heart's chambers and valves are working. This procedure takes approximately one hour. There are no restrictions for this procedure.  Your physician has requested that you have cardiac CT.        Follow-Up: At [facility], you and your health needs are our priority.  As part of our continuing mission to provide you with exceptional heart care, we have created designated Provider Care Teams.  These Care Teams include your primary Cardiologist (physician) and Advanced Practice Providers (APPs -  Physician Assistants and Nurse Practitioners) who all work together to provide you with the care you need, when you need it.  We recommend signing up for the patient portal called "MyChart".  Sign up information is provided on this After Visit Summary.  MyChart is used to connect with patients for Virtual Visits (Telemedicine).  Patients are able to view lab/test results, encounter notes, upcoming  appointments, etc.  Non-urgent messages can be sent to your provider as well.   To learn more about what you can do with MyChart, go to [email or web address].    Your next appointment:   3 month(s)  The format for your next appointment:   In Person  Provider:   You may see Gasper Sells, MD or one of the following Advanced Practice Providers on your designated Care Team:    Melina Copa, PA-C  Ermalinda Barrios, PA-C    Other Instructions Your cardiac CT will be scheduled at one of the below locations:   [facility] [address] [place], [place] [address] [phone]  [place] [address] [place],  [address] [phone]  If scheduled at [facility], please arrive at the [facility] main entrance of [facility] 30 minutes prior to test start time. Proceed to the [facility] Radiology Department (first floor) to check-in and test prep.  If scheduled at [facility], please arrive 15 mins early for check-in and test prep.  Please follow these instructions carefully (unless otherwise directed):   On the Night Before the Test: . Be sure to Drink plenty of water. . Do not consume any caffeinated/decaffeinated beverages or chocolate 12 hours prior to your test. . Do not take any antihistamines 12 hours prior to your test.  On the Day of the Test: . Drink plenty of water. Do not drink any water within one hour of the test. . Do not eat any food 4 hours prior to the test. . You may take your regular medications prior to the test.  . Take metoprolol (Lopressor) 45m  two hours prior to test. . FEMALES- please wear underwire-free bra if available       After the Test: . Drink plenty of water. . After receiving IV contrast, you  may experience a mild flushed feeling. This is normal. . On occasion, you may experience a mild rash up to 24 hours after the test.  This is not dangerous. If this occurs, you can take Benadryl 25 mg and increase your fluid intake. . If you experience trouble breathing, this can be serious. If it is severe call 911 IMMEDIATELY. If it is mild, please call our office. . If you take any of these medications: Glipizide/Metformin, Avandament, Glucavance, please do not take 48 hours after completing test unless otherwise instructed.   Once we have confirmed authorization from your insurance company, we will call you to set up a date and time for your test. Based on how quickly your insurance processes prior authorizations requests, please allow up to 4 weeks to be contacted for scheduling your Cardiac CT appointment. Be advised that routine Cardiac CT appointments could be scheduled as many as 8 weeks after your provider has ordered it.  For non-scheduling related questions, please contact the cardiac imaging nurse navigator should you have any questions/concerns: Marchia Bond, Cardiac Imaging Nurse Navigator Burley Saver, Interim Cardiac Imaging Nurse [place] and Vascular Services Direct Office Dial: [phone]   For scheduling needs, including cancellations and rescheduling, please call [place], [phone].       Signed, Werner Lean, MD  10/25/2020 5:14 PM    [facility]

## 2020-10-25 NOTE — Patient Instructions (Addendum)
Medication Instructions:  Your physician recommends that you continue on your current medications as directed. Please refer to the Current Medication list given to you today.  *If you need a refill on your cardiac medications before your next appointment, please call your pharmacy*   Lab Work: TODAY: BNP, BMP If you have labs (blood work) drawn today and your tests are completely normal, you will receive your results only by: Marland Kitchen MyChart Message (if you have MyChart) OR . A paper copy in the mail If you have any lab test that is abnormal or we need to change your treatment, we will call you to review the results.   Testing/Procedures: Your physician has requested that you have an echocardiogram. Echocardiography is a painless test that uses sound waves to create images of your heart. It provides your doctor with information about the size and shape of your heart and how well your heart's chambers and valves are working. This procedure takes approximately one hour. There are no restrictions for this procedure.  Your physician has requested that you have cardiac CT.        Follow-Up: At [facility], you and your health needs are our priority.  As part of our continuing mission to provide you with exceptional heart care, we have created designated Provider Care Teams.  These Care Teams include your primary Cardiologist (physician) and Advanced Practice Providers (APPs -  Physician Assistants and Nurse Practitioners) who all work together to provide you with the care you need, when you need it.  We recommend signing up for the patient portal called "MyChart".  Sign up information is provided on this After Visit Summary.  MyChart is used to connect with patients for Virtual Visits (Telemedicine).  Patients are able to view lab/test results, encounter notes, upcoming appointments, etc.  Non-urgent messages can be sent to your provider as well.   To learn more about what you can do with MyChart,  go to [email or web address].    Your next appointment:   3 month(s)  The format for your next appointment:   In Person  Provider:   You may see Gasper Sells, MD or one of the following Advanced Practice Providers on your designated Care Team:    Melina Copa, PA-C  Ermalinda Barrios, PA-C    Other Instructions Your cardiac CT will be scheduled at one of the below locations:   [facility] [address] [place], [place] [address] [phone]  [place] [address] [place], [place] [address] [phone]  If scheduled at [facility], please arrive at the [facility] main entrance of [facility] 30 minutes prior to test start time. Proceed to the [facility] Radiology Department (first floor) to check-in and test prep.  If scheduled at [facility], please arrive 15 mins early for check-in and test prep.  Please follow these instructions carefully (unless otherwise directed):   On the Night Before the Test: . Be sure to Drink plenty of water. . Do not consume any caffeinated/decaffeinated beverages or chocolate 12 hours prior to your test. . Do not take any antihistamines 12 hours prior to your test.  On the Day of the Test: . Drink plenty of water. Do not drink any water within one hour of the test. . Do not eat any food 4 hours prior to the test. . You may take your regular medications prior to the test.  . Take metoprolol (Lopressor) 88m  two hours prior to test. . FEMALES- please wear underwire-free bra if available       After the Test: . Drink plenty of water. . After receiving IV contrast, you may experience a mild flushed feeling. This is normal. . On occasion, you may experience a mild rash up to 24 hours after the test. This is not dangerous. If this occurs, you can take Benadryl 25 mg and increase your fluid intake. . If you experience trouble  breathing, this can be serious. If it is severe call 911 IMMEDIATELY. If it is mild, please call our office. . If you take any of these medications: Glipizide/Metformin, Avandament, Glucavance, please do not take 48 hours after completing test unless otherwise instructed.   Once we have confirmed authorization from your insurance company, we will call you to set up a date and time for your test. Based on how quickly your insurance processes prior authorizations requests, please allow up to 4 weeks to be contacted for scheduling your Cardiac CT appointment. Be advised that routine Cardiac CT appointments could be scheduled as many as 8 weeks after your provider has ordered it.  For non-scheduling related questions, please contact the cardiac imaging nurse navigator should you have any questions/concerns: Marchia Bond, Cardiac Imaging Nurse Navigator Burley Saver, Interim Cardiac Imaging Nurse [place] and Vascular Services Direct Office Dial: [phone]   For scheduling needs, including cancellations and rescheduling, please call [place], [phone].

## 2020-10-26 LAB — BASIC METABOLIC PANEL
BUN/Creatinine Ratio: 12 (ref 9–23)
BUN: 8 mg/dL (ref 6–24)
CO2: 21 mmol/L (ref 20–29)
Calcium: 9.5 mg/dL (ref 8.7–10.2)
Chloride: 103 mmol/L (ref 96–106)
Creatinine, Ser: 0.67 mg/dL (ref 0.57–1.00)
GFR calc Af Amer: 119 mL/min/{1.73_m2} (ref >59)
GFR calc non Af Amer: 103 mL/min/{1.73_m2} (ref >59)
Glucose: 143 mg/dL — ABNORMAL HIGH (ref 65–99)
Potassium: 4.1 mmol/L (ref 3.5–5.2)
Sodium: 141 mmol/L (ref 134–144)

## 2020-10-26 LAB — PRO B NATRIURETIC PEPTIDE: NT-Pro BNP: 9 pg/mL (ref 0–249)

## 2020-11-12 ENCOUNTER — Telehealth (HOSPITAL_COMMUNITY): Payer: Self-pay | Admitting: Emergency Medicine

## 2020-11-12 NOTE — Telephone Encounter (Signed)
Attempted to call patient regarding upcoming cardiac CT appointment. °Left message on voicemail with name and callback number °Ellysia Char RN Navigator Cardiac Imaging °[facility] °[phone] Office °[phone] Cell ° °

## 2020-11-15 ENCOUNTER — Other Ambulatory Visit (HOSPITAL_COMMUNITY): Payer: Self-pay | Admitting: Internal Medicine

## 2020-11-15 ENCOUNTER — Ambulatory Visit (HOSPITAL_COMMUNITY): Admission: RE | Admit: 2020-11-15 | Payer: Medicare HMO | Source: Ambulatory Visit

## 2020-11-15 ENCOUNTER — Ambulatory Visit (HOSPITAL_COMMUNITY): Payer: Medicare HMO | Attending: Internal Medicine

## 2020-11-15 DIAGNOSIS — R079 Chest pain, unspecified: Secondary | ICD-10-CM

## 2020-11-16 ENCOUNTER — Other Ambulatory Visit: Payer: Self-pay

## 2020-11-16 ENCOUNTER — Ambulatory Visit (HOSPITAL_COMMUNITY): Payer: Federal, State, Local not specified - PPO | Attending: Cardiovascular Disease

## 2020-11-16 ENCOUNTER — Other Ambulatory Visit: Payer: Medicare HMO

## 2020-11-16 DIAGNOSIS — D869 Sarcoidosis, unspecified: Secondary | ICD-10-CM | POA: Insufficient documentation

## 2020-11-16 LAB — ECHOCARDIOGRAM COMPLETE
Area-P 1/2: 4.39 cm2
S' Lateral: 2.8 cm

## 2020-11-17 ENCOUNTER — Ambulatory Visit
Admission: RE | Admit: 2020-11-17 | Discharge: 2020-11-17 | Disposition: A | Discharge status: Home / Self Care | Payer: Federal, State, Local not specified - PPO | Source: Ambulatory Visit | Attending: General Practice | Admitting: General Practice

## 2020-11-17 DIAGNOSIS — R109 Unspecified abdominal pain: Secondary | ICD-10-CM

## 2020-11-17 MED ORDER — IOPAMIDOL (ISOVUE-300) INJECTION 61%
100.0000 mL | Freq: Once | INTRAVENOUS | Status: AC | PRN
  Administered 2020-11-17: 100 mL via INTRAVENOUS

## 2020-11-18 ENCOUNTER — Telehealth: Payer: Self-pay

## 2020-11-18 NOTE — Telephone Encounter (Signed)
I faxed results of ECHO to Dr. Renard Matter with [facility] via [place].

## 2020-11-18 NOTE — Progress Notes (Unsigned)
Echo results faxed to Dr. Renard Matter with [facility] via [place].

## 2020-11-18 NOTE — Telephone Encounter (Signed)
-----   Message from Werner Lean, MD sent at 11/17/2020  9:53 AM EST ----- Results: No evidence of decrease LVEF or cardiac sarcoidosis Plan: No change in therapy Will send records to Dr. Renard Matter, [place] [phone] (Fax)  Werner Lean, MD

## 2021-02-04 ENCOUNTER — Encounter: Payer: Self-pay | Admitting: Internal Medicine

## 2021-02-04 ENCOUNTER — Ambulatory Visit (INDEPENDENT_AMBULATORY_CARE_PROVIDER_SITE_OTHER): Payer: Federal, State, Local not specified - PPO | Admitting: Internal Medicine

## 2021-02-04 ENCOUNTER — Other Ambulatory Visit: Payer: Self-pay

## 2021-02-04 VITALS — BP 122/90 | HR 98 | Ht 67.5 in | Wt 249.0 lb

## 2021-02-04 DIAGNOSIS — C50912 Malignant neoplasm of unspecified site of left female breast: Secondary | ICD-10-CM | POA: Diagnosis not present

## 2021-02-04 DIAGNOSIS — Z171 Estrogen receptor negative status [ER-]: Secondary | ICD-10-CM

## 2021-02-04 DIAGNOSIS — D869 Sarcoidosis, unspecified: Secondary | ICD-10-CM

## 2021-02-04 DIAGNOSIS — K760 Fatty (change of) liver, not elsewhere classified: Secondary | ICD-10-CM

## 2021-02-04 DIAGNOSIS — I10 Essential (primary) hypertension: Secondary | ICD-10-CM | POA: Diagnosis not present

## 2021-02-04 DIAGNOSIS — G4733 Obstructive sleep apnea (adult) (pediatric): Secondary | ICD-10-CM | POA: Diagnosis not present

## 2021-02-04 LAB — HEPATIC FUNCTION PANEL
ALT: 16 IU/L (ref 0–32)
AST: 14 IU/L (ref 0–40)
Albumin: 4.5 g/dL (ref 3.8–4.8)
Alkaline Phosphatase: 78 IU/L (ref 44–121)
Bilirubin Total: 0.5 mg/dL (ref 0.0–1.2)
Bilirubin, Direct: 0.12 mg/dL (ref 0.00–0.40)
Total Protein: 7.5 g/dL (ref 6.0–8.5)

## 2021-02-04 LAB — LIPID PANEL
Chol/HDL Ratio: 4.2 ratio (ref 0.0–4.4)
Cholesterol, Total: 182 mg/dL (ref 100–199)
HDL: 43 mg/dL (ref >39)
LDL Chol Calc (NIH): 117 mg/dL — ABNORMAL HIGH (ref 0–99)
Triglycerides: 123 mg/dL (ref 0–149)
VLDL Cholesterol Cal: 22 mg/dL (ref 5–40)

## 2021-02-04 NOTE — Patient Instructions (Signed)
Medication Instructions:  Your physician recommends that you continue on your current medications as directed. Please refer to the Current Medication list given to you today.  *If you need a refill on your cardiac medications before your next appointment, please call your pharmacy*   Lab Work: Lipid Panel and Liver blood work today If you have labs (blood work) drawn today and your tests are completely normal, you will receive your results only by: Marland Kitchen MyChart Message (if you have MyChart) OR . A paper copy in the mail If you have any lab test that is abnormal or we need to change your treatment, we will call you to review the results.   Testing/Procedures: None   Follow-Up: At [facility], you and your health needs are our priority.  As part of our continuing mission to provide you with exceptional heart care, we have created designated Provider Care Teams.  These Care Teams include your primary Cardiologist (physician) and Advanced Practice Providers (APPs -  Physician Assistants and Nurse Practitioners) who all work together to provide you with the care you need, when you need it.  We recommend signing up for the patient portal called "MyChart".  Sign up information is provided on this After Visit Summary.  MyChart is used to connect with patients for Virtual Visits (Telemedicine).  Patients are able to view lab/test results, encounter notes, upcoming appointments, etc.  Non-urgent messages can be sent to your provider as well.   To learn more about what you can do with MyChart, go to [email or web address].    Your next appointment:   1 year(s)  The format for your next appointment:   In Person  Provider:   You may see Dr. Gasper Sells or one of the following Advanced Practice Providers on your designated Care Team:    Melina Copa, PA-C  Ermalinda Barrios, PA-C    Other Instructions

## 2021-02-04 NOTE — Progress Notes (Signed)
Cardiology Office Note:    Date:  02/04/2021   ID:  Cynthia Saunders, DOB [birth year], MRN [id number]  PCP:  Rayburn Felt, MD  [facility] Cardiologist:  None  [facility] Electrophysiologist:  None   CC: Follow up CP and SOB  History of Present Illness:    Cynthia Saunders is a 50 y.o. female with a hx of GERD, Sarcoidosis prior biopsy positive in [place] Cynthia Saunders negative as of coming to [place], breast cancer, Gestational HTN and pre-eclamspia, agoraphobia with no issues with imaging in the past who presents for evaluation 10/25/20.  In interim of this visit, patient had normal echo; send results to Dr. Renard Matter. Did not have CCTA.  Seen 02/04/21.  Oncological History notable for: Malignancies: Left Breast Cancer (Triple Negative) Surgery: Bilateral Mastectomy 2009 Chemotherapy: Adriamycin and Cytoxan x4 cycles followed by Taxol  Cessations for Toxicity: None Radiation: Unknown total dose of radiation done with the heart in the field of view (2009)  Oncology care spearheaded by: Dr. Doyle Askew, [place] [phone] (Fax)  Patient notes that she is doing OK.  Since ast visit notes no changes with oncology.  Relevant interval testing or therapy include new buspirinone.  There are no interval hospital/ED visit.    No chest pain or pressure.  Notes SOB when outside that improves with inhalers (seasonal) and no PND/Orthopnea.  No weight gain or leg swelling.  No palpitations or syncope.  Ambulatory blood pressure not done.  Past Medical History:  Diagnosis Date  . Anxiety   . Arthritis   . Asthma   . BRCA1 genetic carrier 01/27/2013  . Breast cancer ([place]) 2009   triple negative left breast  . Breast cancer ([place]) 11/28/2007   Left breast stage T3N1, triple negative  . Depression   . GERD (gastroesophageal reflux disease)    watches what she eats  . Headache(784.0)    thinks due to prozac  . Hypertension    during pregnancy 2002  . Neuromuscular disorder ([place])    neuropathy legs  started after chemo  . Pneumonia    2010, post surgery  . Pollen allergies   . Sarcoidosis   . Shortness of breath   . Sleep apnea     Past Surgical History:  Procedure Laterality Date  . ABDOMINAL HYSTERECTOMY    . bilateral mastectomy Bilateral 2009   left sided breast cancer and prophylactic right  . BREAST SURGERY    . CESAREAN SECTION     x 2  . COLONOSCOPY  2010  . LUMBAR LAMINECTOMY Right 04/18/2013   Procedure: MICRODISCECTOMY LUMBAR LAMINECTOMY;  Surgeon: Marybelle Killings, MD;  Location: [facility];  Service: Orthopedics;  Laterality: Right;  Right L4-5 Microdiscectomy  . MASTECTOMY    . RECONSTRUCTION BREAST W/ TRAM FLAP Bilateral 2011   Done at [facility]  . TUBAL LIGATION      Current Medications: Current Meds  Medication Sig  . albuterol (PROVENTIL HFA;VENTOLIN HFA) 108 (90 BASE) MCG/ACT inhaler Inhale 2 puffs into the lungs every 6 (six) hours as needed for wheezing.  Marland Kitchen ALPRAZolam (XANAX) 1 MG tablet Take 1 mg by mouth 3 (three) times daily as needed for anxiety.  Marland Kitchen amitriptyline (ELAVIL) 25 MG tablet Take by mouth.  Marland Kitchen atorvastatin (LIPITOR) 40 MG tablet Take by mouth daily.  . busPIRone (BUSPAR) 5 MG tablet Take 1 tablet by mouth 2 (two) times daily.  . fluticasone (FLONASE) 50 MCG/ACT nasal spray as needed.  . fluticasone-salmeterol (ADVAIR) 250-50 MCG/ACT AEPB in  the morning and at bedtime.  Marland Kitchen lisinopril (PRINIVIL,ZESTRIL) 10 MG tablet Take 10 mg by mouth daily.  . Menthol, Topical Analgesic, (BIOFREEZE EX) Apply 4 application topically daily as needed (lower back pain).  . metFORMIN (GLUCOPHAGE) 500 MG tablet Take by mouth 2 (two) times daily with a meal.  . methocarbamol (ROBAXIN) 500 MG tablet Take 1 tablet (500 mg total) by mouth 2 (two) times daily.  . Multiple Vitamins-Minerals (MULTIVITAMIN PO) Take 1 tablet by mouth daily.   . Omega-3 1000 MG CAPS Take 1,000 mg by mouth daily.  Marland Kitchen omeprazole (PRILOSEC) 40 MG capsule Take by mouth daily.     Allergies:    Prozac [fluoxetine hcl], Chlorhexidine, and Adhesive [tape]   Social History   Socioeconomic History  . Marital status: Single    Spouse name: Not on file  . Number of children: Not on file  . Years of education: Not on file  . Highest education level: Not on file  Occupational History  . Not on file  Tobacco Use  . Smoking status: Never Smoker  . Smokeless tobacco: Never Used  Substance and Sexual Activity  . Alcohol use: Yes    Alcohol/week: 1.0 standard drink    Types: 1 Glasses of wine per week    Comment: at holidays  . Drug use: No  . Sexual activity: Yes    Birth control/protection: Condom  Other Topics Concern  . Not on file  Social History Narrative  . Not on file   Social Determinants of Health   Financial Resource Strain: Not on file  Food Insecurity: Not on file  Transportation Needs: Not on file  Physical Activity: Not on file  Stress: Not on file  Social Connections: Not on file     Family History: The patient's family history includes Cancer in her paternal grandfather and paternal uncle; Cancer (age of onset: 71) in her cousin; Cancer (age of onset: 105) in her paternal aunt; Diabetes in her father, mother, and paternal grandmother; Hypertension in her father, mother, and sister. History of coronary artery disease notable for no members. History of heart failure notable for no members. History of arrhythmia notable for no members.  ROS:   Please see the history of present illness.     All other systems reviewed and are negative.  EKGs/Labs/Other Studies Reviewed:    The following studies were reviewed today:  EKG:   10/25/2020: sinus rhythm rate 78 WNL  NonCardiac CT: Date: 10/25/2020 Personally reviewed Results: Breath artifact noted; no aortic atherosclerosis, coronary artery calcification, or aortic dilation   Transthoracic Echocardiogram: Date: 06/26/2018 [facility] Report Results:  NORMAL LEFT VENTRICULAR SYSTOLICFUNCTION WITH MILD LVH    NORMAL LA PRESSURES WITH NORMAL DIASTOLIC FUNCTION   NORMAL RIGHT VENTRICULAR SYSTOLIC FUNCTION   VALVULAR REGURGITATION: TRIVIAL MR, TRIVIAL PR, TRIVIAL TR   NO VALVULAR STENOSIS   NO PRIOR STUDY FOR COMPARISON   3D acquisition and reconstructions were performed as part of this   examination to more accurately quantify the effects of Pre/Post Chemo,   Radiation or other therapy. (post-processing on an Independent   workstation).   Date: 11/16/20 Results: 1. Left ventricular ejection fraction, by estimation, is 60 to 65%. The  left ventricle has normal function. The left ventricle has no regional  wall motion abnormalities. Left ventricular diastolic parameters are  consistent with Grade I diastolic  dysfunction (impaired relaxation). Elevated left ventricular end-diastolic  pressure. The average left ventricular global longitudinal strain is -20.9  %. The  global longitudinal strain is normal.  2. Right ventricular systolic function is normal. The right ventricular  size is normal.  3. The mitral valve is normal in structure. Trivial mitral valve  regurgitation. No evidence of mitral stenosis.  4. The aortic valve is tricuspid. Aortic valve regurgitation is not  visualized. No aortic stenosis is present.  5. The inferior vena cava is normal in size with greater than 50%  respiratory variability, suggesting right atrial pressure of 3 mmHg.   Recent Labs: 10/25/2020: BUN 8; Creatinine, Ser 0.67; NT-Pro BNP 9; Potassium 4.1; Sodium 141  Recent Lipid Panel No results found for: CHOL, TRIG, HDL, CHOLHDL, VLDL, LDLCALC, LDLDIRECT   Risk Assessment/Calculations:     N/A  Physical Exam:    VS:  BP 122/90   Pulse 98   Ht 5' 7.5" (1.715 m)   Wt 112.9 kg   SpO2 94%   BMI 38.42 kg/m     Wt Readings from Last 3 Encounters:  02/04/21 112.9 kg  10/25/20 110.7 kg  03/24/20 111.1 kg    GEN: Obese, well developed in no acute distress HEENT: Normal NECK: No JVD; No  carotid bruits LYMPHATICS: No lymphadenopathy CARDIAC: RRR, no murmurs, rubs, gallops RESPIRATORY:  Clear to auscultation without rales, wheezing or rhonchi  ABDOMEN: Soft, non-tender, non-distended MUSCULOSKELETAL:  No edema; No deformity  SKIN: Warm and dry NEUROLOGIC:  Alert and oriented x 3 PSYCHIATRIC:  Normal affect   ASSESSMENT:    1. Malignant neoplasm of left breast in female, estrogen receptor negative, unspecified site of breast ([place])   2. Hypertension, unspecified type   3. Morbid obesity ([place])   4. OSA (obstructive sleep apnea)   5. Sarcoidosis   6. NAFLD (nonalcoholic fatty liver disease)    PLAN:    In order of problems listed above:  Chest Pain Syndrome (resolved) HTN with hc of pre-eclampsia and gestational HTN Prior Malignancy with Cardiotoxic Chemotherapy Morbid Obesity OSA  NAFLD -elevated risk with gestation HTN, pre-eclampsia, and prior radiation - continue lisinopril 10 mg PO Daily - ambulatory blood pressure not done, will star ambulatory BP monitoring; gave education on how to perform ambulatory blood pressure monitoring including the frequency and technique; goal ambulatory blood pressure < 130/80 on average - LDL goal < 70; sendings lipids and LFTs today - LV Function including GLS or other Strain, 3D Eval, MAPSE, or CMR: EF 60-65^, GLS -21 - with prior cardiotoxic chemotherapy and radiation - Encouraged exercise on a regular basis and healthy dietary habits  Cardiac Sarcoidosis Eval -With extracardiac sarcoidosis and agoraphobia - with biopsy - presently no plans for advanced imaging  One year follow up unless new symptoms or abnormal test results warranting change in plan  Would be reasonable for  Video Visit Follow up Would be reasonable for  APP Follow up      Medication Adjustments/Labs and Tests Ordered: Current medicines are reviewed at length with the patient today.  Concerns regarding medicines are outlined above.  Orders Placed  This Encounter  Procedures  . Lipid panel  . Hepatic function panel   No orders of the defined types were placed in this encounter.   Patient Instructions  Medication Instructions:  Your physician recommends that you continue on your current medications as directed. Please refer to the Current Medication list given to you today.  *If you need a refill on your cardiac medications before your next appointment, please call your pharmacy*   Lab Work: Lipid Panel and Liver blood work today  If you have labs (blood work) drawn today and your tests are completely normal, you will receive your results only by: Marland Kitchen MyChart Message (if you have MyChart) OR . A paper copy in the mail If you have any lab test that is abnormal or we need to change your treatment, we will call you to review the results.   Testing/Procedures: None   Follow-Up: At [facility], you and your health needs are our priority.  As part of our continuing mission to provide you with exceptional heart care, we have created designated Provider Care Teams.  These Care Teams include your primary Cardiologist (physician) and Advanced Practice Providers (APPs -  Physician Assistants and Nurse Practitioners) who all work together to provide you with the care you need, when you need it.  We recommend signing up for the patient portal called "MyChart".  Sign up information is provided on this After Visit Summary.  MyChart is used to connect with patients for Virtual Visits (Telemedicine).  Patients are able to view lab/test results, encounter notes, upcoming appointments, etc.  Non-urgent messages can be sent to your provider as well.   To learn more about what you can do with MyChart, go to [email or web address].    Your next appointment:   1 year(s)  The format for your next appointment:   In Person  Provider:   You may see Dr. Gasper Sells or one of the following Advanced Practice Providers on your designated Care Team:     Melina Copa, PA-C  Ermalinda Barrios, PA-C    Other Instructions      Signed, Werner Lean, MD  02/04/2021 10:35 AM    [place]

## 2021-02-07 ENCOUNTER — Telehealth: Payer: Self-pay

## 2021-02-07 MED ORDER — ATORVASTATIN CALCIUM 40 MG PO TABS: 40.0000 mg | ORAL_TABLET | Freq: Every day | ORAL | 3 refills | Status: AC

## 2021-02-07 NOTE — Telephone Encounter (Signed)
-----   Message from Werner Lean, MD sent at 02/05/2021 12:20 PM EDT ----- Results: Elevated cholesterol Plan: Atorvastatin 20 mg PO Daily and lipids/LFTs in three months  Werner Lean, MD

## 2021-02-07 NOTE — Telephone Encounter (Signed)
Called patient, notified her of results will send in a refill for atorvastatin 40 mg PO QD.

## 2021-03-15 ENCOUNTER — Other Ambulatory Visit: Payer: Self-pay

## 2021-03-15 ENCOUNTER — Encounter: Payer: Self-pay | Admitting: Orthopaedic Surgery

## 2021-03-15 ENCOUNTER — Ambulatory Visit (INDEPENDENT_AMBULATORY_CARE_PROVIDER_SITE_OTHER): Payer: Federal, State, Local not specified - PPO | Admitting: Orthopaedic Surgery

## 2021-03-15 VITALS — BP 143/90 | HR 66 | Ht 67.5 in | Wt 246.0 lb

## 2021-03-15 DIAGNOSIS — Z9889 Other specified postprocedural states: Secondary | ICD-10-CM | POA: Diagnosis not present

## 2021-03-15 DIAGNOSIS — G8929 Other chronic pain: Secondary | ICD-10-CM | POA: Diagnosis not present

## 2021-03-15 DIAGNOSIS — M545 Low back pain, unspecified: Secondary | ICD-10-CM | POA: Diagnosis not present

## 2021-03-15 NOTE — Progress Notes (Signed)
Office Visit Note   Patient: Cynthia Saunders           Date of Birth: [birth year]           MRN: [id number] Visit Date: 03/15/2021              Requested by: Rayburn Felt, MD No address on file PCP: Rayburn Felt, MD   Assessment & Plan: Visit Diagnoses:  1. Chronic bilateral low back pain, unspecified whether sciatica present   2. Status post lumbar microdiscectomy     Plan:   Patient states she is still waiting on MRI scan lumbar spine to be done.  We discussed that almost 1 year ago.  Addendum: Checking with Worker's Comp. 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  This was communicated to the patient by our staff.  The patient get an MRI scan it would have to be approved through her private insurance.  MRI was ordered on 03/02/2020.  03/09/2020 patient was notified and stated she wanted to contact the Department of Labor.  Follow-Up Instructions: No follow-ups on file.   Orders:  Orders Placed This Encounter  Procedures   MR Lumbar Spine W Wo Contrast   No orders of the defined types were placed in this encounter.     Procedures: No procedures performed   Clinical Data: No additional findings.   Subjective: Chief Complaint  Patient presents with   Lower Back - Pain    HPI 50 year old female returns and have not seen her since 03/24/2020 she states she has not been contacted about the MRI scan lumbar spine for follow-up from last year's visit.  She had a microdiscectomy L4-5 level done by me August 2014.  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  She states she had some recurrence of back and leg pain however her case has been closed.  She had some wrist problems and had wrist surgery done in [place], [place].  Patient denies bowel or bladder symptoms.  She states she has increased back symptoms when she is active.  Review of Systems no chills or fever.   Objective: Vital Signs: BP (!) 143/90   Pulse 66   Ht 5' 7.5"  (1.715 m)   Wt 246 lb (111.6 kg)   BMI 37.96 kg/m   Physical Exam Constitutional:      Appearance: She is well-developed.  HENT:     Head: Normocephalic.     Right Ear: External ear normal.     Left Ear: External ear normal. There is no impacted cerumen.  Eyes:     Pupils: Pupils are equal, round, and reactive to light.  Neck:     Thyroid: No thyromegaly.     Trachea: No tracheal deviation.  Cardiovascular:     Rate and Rhythm: Normal rate.  Pulmonary:     Effort: Pulmonary effort is normal.  Abdominal:     Palpations: Abdomen is soft.  Musculoskeletal:     Cervical back: No rigidity.  Skin:    General: Skin is warm and dry.  Neurological:     Mental Status: She is alert and oriented to person, place, and time.  Psychiatric:        Behavior: Behavior normal.    Ortho Exam negative logroll the hips negative straight leg raising.  Specialty Comments:  No specialty comments available.  Imaging: No results found.   PMFS History: Patient Active Problem List   Diagnosis Date  Noted   Hypertension 02/04/2021   Morbid obesity ([place]) 02/04/2021   OSA (obstructive sleep apnea) 02/04/2021   NAFLD (nonalcoholic fatty liver disease) 02/04/2021   Sarcoidosis 10/25/2020   Status post lumbar microdiscectomy 11/15/2018   Impingement syndrome of right shoulder 05/21/2018   Degenerative lumbar disc 05/07/2018   HNP (herniated nucleus pulposus), lumbar 04/18/2013    Class: Diagnosis of   BRCA1 genetic carrier 01/27/2013   Breast cancer ([place]) 11/28/2007   Past Medical History:  Diagnosis Date   Anxiety    Arthritis    Asthma    BRCA1 genetic carrier 01/27/2013   Breast cancer ([place]) 2009   triple negative left breast   Breast cancer ([place]) 11/28/2007   Left breast stage T3N1, triple negative   Depression    GERD (gastroesophageal reflux disease)    watches what she eats   Headache(784.0)    thinks due to prozac   Hypertension    during pregnancy 2002   Neuromuscular  disorder ([place])    neuropathy legs started after chemo   Pneumonia    2010, post surgery   Pollen allergies    Sarcoidosis    Shortness of breath    Sleep apnea     Family History  Problem Relation Age of Onset   Diabetes Mother    Hypertension Mother    Hypertension Father    Diabetes Father    Hypertension Sister    Cancer Paternal Aunt 77       breast cancer died at 79   Cancer Paternal Uncle        lung cancer - 3 uncles   Diabetes Paternal Grandmother    Cancer Paternal Grandfather        lung cancer   Cancer Cousin 40       breast cancer deceased at 35    Past Surgical History:  Procedure Laterality Date   ABDOMINAL HYSTERECTOMY     bilateral mastectomy Bilateral 2009   left sided breast cancer and prophylactic right   BREAST SURGERY     CESAREAN SECTION     x 2   COLONOSCOPY  2010   LUMBAR LAMINECTOMY Right 04/18/2013   Procedure: MICRODISCECTOMY LUMBAR LAMINECTOMY;  Surgeon: Marybelle Killings, MD;  Location: [facility];  Service: Orthopedics;  Laterality: Right;  Right L4-5 Microdiscectomy   MASTECTOMY     RECONSTRUCTION BREAST W/ TRAM FLAP Bilateral 2011   Done at [facility] History   Occupational History   Not on file  Tobacco Use   Smoking status: Never   Smokeless tobacco: Never  Substance and Sexual Activity   Alcohol use: Yes    Alcohol/week: 1.0 standard drink    Types: 1 Glasses of wine per week    Comment: at holidays   Drug use: No   Sexual activity: Yes    Birth control/protection: Condom

## 2021-03-24 ENCOUNTER — Telehealth: Payer: Self-pay | Admitting: *Deleted

## 2021-03-24 NOTE — Telephone Encounter (Signed)
I sent message by secure chat to [place] with [facility] informing of the [facility] case and to see if can get pt scheduled. They will call pt to schedule appt

## 2021-03-24 NOTE — Telephone Encounter (Signed)
-----   Message from Deboraha Sprang sent at 03/23/2021  4:16 PM EDT ----- Regarding: SECURE MRI schedule please [place]    The case number from the Dept of Labor for the lower back regarding the MRI is [id number] per Ms. Kennon Rounds. Can you see about getting this scheduled with [facility] please. DR. Lorin Mercy put this in 03/15/21. Donnald Garre been trying to get Dept of Labor to call me back on this but, did go online and the case number is open. Thx u [employer]

## 2021-03-29 ENCOUNTER — Encounter: Payer: Self-pay | Admitting: Orthopaedic Surgery

## 2021-03-29 ENCOUNTER — Other Ambulatory Visit: Payer: Self-pay | Admitting: Physician Assistant

## 2021-03-29 ENCOUNTER — Ambulatory Visit (INDEPENDENT_AMBULATORY_CARE_PROVIDER_SITE_OTHER): Payer: Federal, State, Local not specified - PPO | Admitting: Orthopaedic Surgery

## 2021-03-29 ENCOUNTER — Ambulatory Visit: Payer: Self-pay

## 2021-03-29 ENCOUNTER — Other Ambulatory Visit: Payer: Self-pay

## 2021-03-29 VITALS — Ht 67.5 in

## 2021-03-29 DIAGNOSIS — M25551 Pain in right hip: Secondary | ICD-10-CM | POA: Diagnosis not present

## 2021-03-29 DIAGNOSIS — G8929 Other chronic pain: Secondary | ICD-10-CM

## 2021-03-29 DIAGNOSIS — M25552 Pain in left hip: Secondary | ICD-10-CM

## 2021-03-29 MED ORDER — PREDNISONE 10 MG (21) PO TBPK: ORAL_TABLET | ORAL | 0 refills | Status: AC

## 2021-03-29 NOTE — Progress Notes (Signed)
Office Visit Note   Patient: Cynthia Saunders           Date of Birth: 05-13-1971           MRN: [id number] Visit Date: 03/29/2021              Requested by: Rayburn Felt, MD No address on file PCP: Rayburn Felt, MD   Assessment & Plan: Visit Diagnoses:  1. Chronic hip pain, bilateral     Plan: Impression is chronic bilateral hip pain both equally as bad.  I believe the majority of the patient's symptoms are referred from her back.  She does have moderate tenderness to the trochanteric bursa on both sides but because of the additional pain to the bilateral lower back I believe this could be referred.  We have discussed starting her on a steroid taper as well as starting a course of physical therapy for which she is agreeable to.  Should her symptoms continue, she will follow-up with Dr. Lorin Mercy for further evaluation of her lumbar spine.  Follow-Up Instructions: Return if symptoms worsen or fail to improve.   Orders:  Orders Placed This Encounter  Procedures   XR Lumbar Spine 2-3 Views   XR HIPS BILAT W OR W/O PELVIS 3-4 VIEWS   Ambulatory referral to Physical Therapy   No orders of the defined types were placed in this encounter.     Procedures: No procedures performed   Clinical Data: No additional findings.   Subjective: Chief Complaint  Patient presents with   Left Hip - Pain   Right Hip - Pain    HPI patient is a pleasant 50 year old female who comes in today with bilateral hip pain both equally as bad.  She has been dealing with this for the past 6 months or so.  The pain is primarily to the left lower back on the left and radiates to the lateral hip.  In regards to the right-sided not only is to the right lower back that radiates into the groin.  She has paresthesias down the back of both legs.  Pain is worse going from a seated to standing position as well as when she is driving long distance, sitting a long period of time or going up and down stairs.  She has  been taking Tylenol and naproxen without significant relief.  She does note previous lumbar surgery L4-5 by Dr. Lorin Mercy several years ago.  Review of Systems as detailed in HPI.  All others reviewed are negative.   Objective: Vital Signs: Ht 5' 7.5" (1.715 m)   BMI 37.96 kg/m   Physical Exam well-developed and well-nourished female in no acute distress.  Alert and oriented x3.  Ortho Exam bilateral hip exam is negative for logroll and FADIR.  Negative straight leg raise both sides.  She has slight increased pain with lumbar extension.  Moderate tenderness to the greater trochanter both sides.  Tenderness to the bilateral paraspinous musculature.  She is neurovascular intact distally.  Specialty Comments:  No specialty comments available.  Imaging: XR HIPS BILAT W OR W/O PELVIS 3-4 VIEWS  Result Date: 03/29/2021 Mild degenerative changes to the hips bilaterally  XR Lumbar Spine 2-3 Views  Result Date: 03/29/2021 Mild degenerative changes L5-S1    PMFS History: Patient Active Problem List   Diagnosis Date Noted   Hypertension 02/04/2021   Morbid obesity ([place]) 02/04/2021   OSA (obstructive sleep apnea) 02/04/2021   NAFLD (nonalcoholic fatty liver disease) 02/04/2021   Sarcoidosis 10/25/2020  Status post lumbar microdiscectomy 11/15/2018   Impingement syndrome of right shoulder 05/21/2018   Degenerative lumbar disc 05/07/2018   HNP (herniated nucleus pulposus), lumbar 04/18/2013    Class: Diagnosis of   BRCA1 genetic carrier 01/27/2013   Breast cancer ([place]) 11/28/2007   Past Medical History:  Diagnosis Date   Anxiety    Arthritis    Asthma    BRCA1 genetic carrier 01/27/2013   Breast cancer ([place]) 2009   triple negative left breast   Breast cancer ([place]) 11/28/2007   Left breast stage T3N1, triple negative   Depression    GERD (gastroesophageal reflux disease)    watches what she eats   Headache(784.0)    thinks due to prozac   Hypertension    during pregnancy 2002    Neuromuscular disorder ([place])    neuropathy legs started after chemo   Pneumonia    2010, post surgery   Pollen allergies    Sarcoidosis    Shortness of breath    Sleep apnea     Family History  Problem Relation Age of Onset   Diabetes Mother    Hypertension Mother    Hypertension Father    Diabetes Father    Hypertension Sister    Cancer Paternal Aunt 80       breast cancer died at 51   Cancer Paternal Uncle        lung cancer - 3 uncles   Diabetes Paternal Grandmother    Cancer Paternal Grandfather        lung cancer   Cancer Cousin 26       breast cancer deceased at 72    Past Surgical History:  Procedure Laterality Date   ABDOMINAL HYSTERECTOMY     bilateral mastectomy Bilateral 2009   left sided breast cancer and prophylactic right   BREAST SURGERY     CESAREAN SECTION     x 2   COLONOSCOPY  2010   LUMBAR LAMINECTOMY Right 04/18/2013   Procedure: MICRODISCECTOMY LUMBAR LAMINECTOMY;  Surgeon: Marybelle Killings, MD;  Location: [facility];  Service: Orthopedics;  Laterality: Right;  Right L4-5 Microdiscectomy   MASTECTOMY     RECONSTRUCTION BREAST W/ TRAM FLAP Bilateral 2011   Done at [facility] History   Occupational History   Not on file  Tobacco Use   Smoking status: Never   Smokeless tobacco: Never  Substance and Sexual Activity   Alcohol use: Yes    Alcohol/week: 1.0 standard drink    Types: 1 Glasses of wine per week    Comment: at holidays   Drug use: No   Sexual activity: Yes    Birth control/protection: Condom

## 2021-04-07 ENCOUNTER — Ambulatory Visit
Admission: RE | Admit: 2021-04-07 | Discharge: 2021-04-07 | Disposition: A | Discharge status: Home / Self Care | Source: Ambulatory Visit | Attending: Orthopaedic Surgery | Admitting: Orthopaedic Surgery

## 2021-04-07 DIAGNOSIS — G8929 Other chronic pain: Secondary | ICD-10-CM

## 2021-04-07 DIAGNOSIS — M545 Low back pain, unspecified: Secondary | ICD-10-CM

## 2021-04-07 MED ORDER — GADOBENATE DIMEGLUMINE 529 MG/ML IV SOLN
20.0000 mL | Freq: Once | INTRAVENOUS | Status: AC | PRN
  Administered 2021-04-07: 20 mL via INTRAVENOUS

## 2021-04-11 ENCOUNTER — Ambulatory Visit: Payer: Federal, State, Local not specified - PPO | Admitting: Rehabilitative and Restorative Service Providers"

## 2021-04-20 ENCOUNTER — Ambulatory Visit (INDEPENDENT_AMBULATORY_CARE_PROVIDER_SITE_OTHER): Payer: Federal, State, Local not specified - PPO | Admitting: Orthopaedic Surgery

## 2021-04-20 ENCOUNTER — Encounter: Payer: Self-pay | Admitting: Orthopaedic Surgery

## 2021-04-20 ENCOUNTER — Other Ambulatory Visit: Payer: Self-pay

## 2021-04-20 VITALS — BP 129/84 | HR 78 | Ht 67.5 in | Wt 246.0 lb

## 2021-04-20 DIAGNOSIS — Z9889 Other specified postprocedural states: Secondary | ICD-10-CM

## 2021-04-20 NOTE — Progress Notes (Signed)
Office Visit Note   Patient: Cynthia Saunders           Date of Birth: October 02, 1970           MRN: [id number] Visit Date: 04/20/2021              Requested by: Rayburn Felt, MD No address on file PCP: Rayburn Felt, MD   Assessment & Plan: Visit Diagnoses:  1. Status post lumbar microdiscectomy     Plan: Lumbar MRI scan is reviewed.  Postop changes noted without significant residual or recurrent compression.  She has some disc desiccation at the bottom 2 levels without compression.  Would recommend work activity in line with the FCE findings.  She had been previously rated by me there is no change in impairment rating which was assigned to her on 09/29/2015 which was 4% of the whole body using sixth edition AMA guidelines.  There is no change in impairment rating.  My recommendation is work activity in line with findings of FCE which is the same statement I have made for the last 5 years in a row.  Follow-Up Instructions: No follow-ups on file.   Orders:  No orders of the defined types were placed in this encounter.  No orders of the defined types were placed in this encounter.     Procedures: No procedures performed   Clinical Data: No additional findings.   Subjective: Chief Complaint  Patient presents with   Lower Back - Pain, Follow-up    MRI lumbar review    HPI 50 year old female works for [employer] was injured back in 2008.  She had microdiscectomy in 2014 which was 6 years after original injury.  Her Worker's Comp. case was closed and then apparently has been reopened.  New MRI scan has been obtained and is available for review.  She has been through [employer].  She has been through some disc different schooling for training for another type of job.  She complains of back pain with prolonged standing.  Back discomfort with prolonged driving.  Patient does have diabetes has been on metformin.  Lumbar MRI scan is available for review results are below.  Patient's had an FCE  and had been released by me for work in line with findings on [employer].  Review of Systems positive for diagnosis of sarcoidosis.  Obesity, hypertension history of breast cancer.  Negative for stroke or MI.   Objective: Vital Signs: BP 129/84   Pulse 78   Ht 5' 7.5" (1.715 m)   Wt 246 lb (111.6 kg)   BMI 37.96 kg/m   Physical Exam Constitutional:      Appearance: She is well-developed.  HENT:     Head: Normocephalic.     Right Ear: External ear normal.     Left Ear: External ear normal. There is no impacted cerumen.  Eyes:     Pupils: Pupils are equal, round, and reactive to light.  Neck:     Thyroid: No thyromegaly.     Trachea: No tracheal deviation.  Cardiovascular:     Rate and Rhythm: Normal rate.  Pulmonary:     Effort: Pulmonary effort is normal.  Abdominal:     Palpations: Abdomen is soft.  Musculoskeletal:     Cervical back: No rigidity.  Skin:    General: Skin is warm and dry.  Neurological:     Mental Status: She is alert and oriented to person, place, and time.  Psychiatric:  Behavior: Behavior normal.    Ortho Exam well-healed lumbar incision.  Negative logroll the hips anterior tib EHL is strong normal toe flexion posterior tib are strong.  Negative [place].  Specialty Comments:  No specialty comments available.  Imaging: Narrative & Impression  CLINICAL DATA:  Low back pain, > 6 wks low back pain, previous right L4-5 microdiscectomy 2014   EXAM: MRI LUMBAR SPINE WITHOUT AND WITH CONTRAST   TECHNIQUE: Multiplanar and multiecho pulse sequences of the lumbar spine were obtained without and with intravenous contrast.   CONTRAST:  57m MULTIHANCE GADOBENATE DIMEGLUMINE 529 MG/ML IV SOLN   COMPARISON:  Lumbar spine radiograph 03/29/2021   FINDINGS: Segmentation: For the purposes of the this dictation, the most well-formed in feared disc space will be labeled L5-S1.   Alignment:  Physiologic.   Vertebrae: No fracture, evidence of discitis, or  aggressive bone lesion.   Conus medullaris and cauda equina: Conus extends to the L1 level. Conus and cauda equina appear normal.   Paraspinal and other soft tissues: Negative. There is no abnormal enhancement.   Disc levels:   T12-L1: No significant spinal canal or neural foraminal narrowing.   L1-L2: No significant spinal canal or neural foraminal narrowing.   L2-L3: No significant spinal canal or neural foraminal narrowing.   L3-L4: No significant spinal canal or neural foraminal narrowing.   L4-L5: Prior right hemilaminotomy and microdiscectomy. There is mild asymmetric left disc bulging and left-sided ligamentum flavum hypertrophy with facet arthropathy resulting and mild subarticular stenosis and mild bilateral foraminal narrowing.   L5-S1: Disc desiccation with minimal disc bulging. No significant spinal canal or neural foraminal narrowing.   IMPRESSION: Prior right hemilaminotomy and microdiscectomy at L4-L5. Disc desiccation with asymmetric left disc bulging and facet arthropathy at L4-L5 resulting in mild subarticular stenosis and bilateral neural foraminal narrowing.   Disc desiccation with minimal disc bulging at L5-S1. No stenosis or impingement at this level.     Electronically Signed   By: JMaurine Simmering  On: 04/08/2021 15:48       PMFS History: Patient Active Problem List   Diagnosis Date Noted   Hypertension 02/04/2021   Morbid [place] ([place] 02/04/2021   OSA (obstructive sleep apnea) 02/04/2021   NAFLD (nonalcoholic fatty liver disease) 02/04/2021   Sarcoidosis 10/25/2020   Status post lumbar microdiscectomy 11/15/2018   Impingement syndrome of right shoulder 05/21/2018   Degenerative lumbar disc 05/07/2018   HNP (herniated nucleus pulposus), lumbar 04/18/2013    Class: Diagnosis of   BRCA1 genetic carrier 01/27/2013   Breast cancer ([place] 11/28/2007   Past Medical History:  Diagnosis Date   Anxiety    Arthritis    Asthma    BRCA1 genetic  carrier 01/27/2013   [place]ast cancer ([facility]   triple negative left breast   [place]ast cancer ([facility] 11/28/2007   Left breast stage T3N1, triple negative   Depression    GERD (gastroesophageal reflux disease)    watches what she eats   Headache(784.0)    thinks due to prozac   Hypertension    during pregnancy 2002   Neuromuscular disorder [place]    neuropathy legs started after chemo   Pneumonia    2010, post surgery   Pollen allergies    Sarcoidosis    Shortness of breath    Sleep apnea     Family History  Problem Relation Age of Onset   Diabetes Mother    Hypertension Mother    Hypertension Father    Diabetes  Father    Hypertension Sister    Cancer Paternal Aunt 28       breast cancer died at 89   Cancer Paternal Uncle        lung cancer - 3 uncles   Diabetes Paternal Grandmother    Cancer Paternal Grandfather        lung cancer   Cancer Cousin 8       breast cancer deceased at 7    Past Surgical History:  Procedure Laterality Date   ABDOMINAL HYSTERECTOMY     bilateral mastectomy Bilateral 2009   left sided breast cancer and prophylactic right   BREAST SURGERY     CESAREAN SECTION     x 2   COLONOSCOPY  2010   LUMBAR LAMINECTOMY Right 04/18/2013   Procedure: MICRODISCECTOMY LUMBAR LAMINECTOMY;  Surgeon: Marybelle Killings, MD;  Location: [place];  Service: Orthopedics;  Laterality: Right;  Right L4-5 Microdiscectomy   MASTECTOMY     RECONSTRUCTION BREAST W/ TRAM FLAP Bilateral 2011   Done at [place] History   Occupational History   Not on file  Tobacco Use   Smoking status: Never   Smokeless tobacco: Never  Substance and Sexual Activity   Alcohol use: Yes    Alcohol/week: 1.0 standard drink    Types: 1 Glasses of wine per week    Comment: at holidays   Drug use: No   Sexual activity: Yes    Birth control/protection: Condom

## 2021-04-27 ENCOUNTER — Encounter: Payer: Self-pay | Admitting: Rehabilitative and Restorative Service Providers"

## 2021-04-27 ENCOUNTER — Ambulatory Visit (INDEPENDENT_AMBULATORY_CARE_PROVIDER_SITE_OTHER): Payer: Federal, State, Local not specified - PPO | Admitting: Rehabilitative and Restorative Service Providers"

## 2021-04-27 ENCOUNTER — Other Ambulatory Visit: Payer: Self-pay

## 2021-04-27 DIAGNOSIS — R293 Abnormal posture: Secondary | ICD-10-CM | POA: Diagnosis not present

## 2021-04-27 DIAGNOSIS — M5441 Lumbago with sciatica, right side: Secondary | ICD-10-CM | POA: Diagnosis not present

## 2021-04-27 DIAGNOSIS — M5442 Lumbago with sciatica, left side: Secondary | ICD-10-CM

## 2021-04-27 DIAGNOSIS — G8929 Other chronic pain: Secondary | ICD-10-CM

## 2021-04-27 DIAGNOSIS — R262 Difficulty in walking, not elsewhere classified: Secondary | ICD-10-CM | POA: Diagnosis not present

## 2021-04-27 DIAGNOSIS — M6281 Muscle weakness (generalized): Secondary | ICD-10-CM

## 2021-04-27 NOTE — Patient Instructions (Signed)
Access Code: [email or web address] URL: [email or web address] Date: 04/27/2021 Prepared by: Vista Mink  Exercises Standing Scapular Retraction - 5 x daily - 7 x weekly - 1 sets - 5 reps - 5 second hold Standing Lumbar Extension at Wall - Forearms - 5 x daily - 7 x weekly - 1 sets - 5 reps - 3 seconds hold Single Knee to Chest Stretch - 2-3 x daily - 7 x weekly - 1 sets - 5 reps - 20 seconds hold Supine Figure 4 Piriformis Stretch - 2-3 x daily - 7 x weekly - 1 sets - 5 reps - 20 seconds hold

## 2021-04-27 NOTE — Therapy (Addendum)
[facility], [place], [address] Phone: [phone]   Fax:  [phone]  Physical Therapy Evaluation  Patient Details  Name: Cynthia Saunders MRN: [id number] Date of Birth: 05/06/1971 Referring Provider (PT): Aundra Dubin PA-C  Referring diagnosis? M25.551  M25.552  G89.29 Treatment diagnosis? (if different than referring diagnosis) R26.2  R29.3  M54.42  G89.29  M54.41  M62.81 What was this (referring dx) caused by? [] Surgery [] Fall [x] Ongoing issue [] Arthritis [] Other: ____________  Laterality: [] Rt [] Lt [x] Both  Check all possible CPT codes:      [x] 81017 (Therapeutic Exercise)  [] 51025 (SLP Treatment)  [x] 85277 (Neuro Re-ed)   [] 82423 (Swallowing Treatment)   [x] 53614 (Gait Training)   [] [phone] (Cognitive Training, 1st 15 minutes) [x] 00867 (Manual Therapy)   [] 61950 (Cognitive Training, each add'l 15 minutes)  [x] 93267 (Therapeutic Activities)  [] Other, List CPT Code ____________    [x] 12458 (Self Care)       [x] All codes above (09983 - 38250)  [x] 53976 (Mechanical Traction)  [x] 97014 (E-stim Unattended)  [] 73419 (E-stim manual)  [] 37902 (Ionto)  [] 40973 (Ultrasound)  [] 53299 (Orthotic Fit) [x] [id number] (Physical Performance Training) [] [id number] (Aquatic Therapy) [] 24268 (Contrast Bath) [] [id number] (Paraffin) [] 34196 (Wound Care 1st 20 sq cm) [] 22297 (Wound Care each add'l 20 sq cm) [] 98921 (Vasopneumatic Device) [] 19417 (Orthotic Training) [] [id number] (Prosthetic Training)  Encounter Date: 04/27/2021   PT End of Session - 04/27/21 1721     Visit Number 1    Number of Visits 24    Date for PT Re-Evaluation 07/20/21    PT Start Time 1147    PT Stop Time 1230    PT Time Calculation (min) 43 min    Activity Tolerance No increased pain;Patient tolerated treatment well    Behavior During Therapy [facility] for tasks assessed/performed             Past Medical History:  Diagnosis  Date   Anxiety    Arthritis    Asthma    BRCA1 genetic carrier 01/27/2013   Breast cancer ([place]) 2009   triple negative left breast   Breast cancer ([place]) 11/28/2007   Left breast stage T3N1, triple negative   Depression    GERD (gastroesophageal reflux disease)    watches what she eats   Headache(784.0)    thinks due to prozac   Hypertension    during pregnancy 2002   Neuromuscular disorder ([place])    neuropathy legs started after chemo   Pneumonia    2010, post surgery   Pollen allergies    Sarcoidosis    Shortness of breath    Sleep apnea     Past Surgical History:  Procedure Laterality Date   ABDOMINAL HYSTERECTOMY     bilateral mastectomy Bilateral 2009   left sided breast cancer and prophylactic right   BREAST SURGERY     CESAREAN SECTION     x 2   COLONOSCOPY  2010   LUMBAR LAMINECTOMY Right 04/18/2013   Procedure: MICRODISCECTOMY LUMBAR LAMINECTOMY;  Surgeon: Marybelle Killings, MD;  Location: [place];  Service: Orthopedics;  Laterality: Right;  Right L4-5 Microdiscectomy   MASTECTOMY     RECONSTRUCTION BREAST W/ TRAM FLAP Bilateral 2011   Done at [place]      There were no vitals filed for  this visit.    Subjective Assessment - 04/27/21 1338     Subjective Cynthia Saunders had a lumbar laminectomy/discectomy in 2014.  She says she has not had a normal back since the injury that preceeded surgery.  She notes B hip/gluteal has been increasing lately with R side worse than L.    Pertinent History Previous laminectomy/discectomy 04/2013, HTN, breast cancer survivor, obesity    Limitations Sitting;Reading;House hold activities;Lifting;Standing;Walking    Patient Stated Goals Be able to do more with less pain    Currently in Pain? Yes    Pain Score 5     Pain Location Back    Pain Orientation Lower    Pain Descriptors / Indicators Aching;Sore    Pain Type Chronic pain    Pain Radiating Towards B hips and legs (R more distal than L)    Pain Onset More than a  month ago    Pain Frequency Constant    Aggravating Factors  Prolonged postures    Pain Relieving Factors Change of position    Effect of Pain on Daily Activities On disability    Multiple Pain Sites No                [facility] PT Assessment - 04/27/21 0001       Assessment   Medical Diagnosis B hip pain    Referring Provider (PT) Aundra Dubin PA-C    Onset Date/Surgical Date --   Chronic (surgery 2014)     Precautions   Precautions Back    Precaution Comments Avoid flexion and slouched postures      Balance Screen   Has the patient fallen in the past 6 months No    Has the patient had a decrease in activity level because of a fear of falling?  No    Is the patient reluctant to leave their home because of a fear of falling?  No      Home Ecologist residence    Additional Comments 4 stairs with handrail      Prior Function   Level of Independence Independent    Vocation On disability    Vocation Requirements [employer]    Leisure Psychologist, occupational with [employer]      Cognition   Overall Cognitive Status Within Functional Limits for tasks assessed      Observation/Other Assessments   Focus on Therapeutic Outcomes (FOTO)  36 (Goal 61 at visit 14)      ROM / Strength   AROM / PROM / Strength AROM      AROM   Overall AROM  Deficits    AROM Assessment Site Lumbar;Hip    Right/Left Hip Left;Right    Right Hip Flexion 80    Left Hip Flexion 85    Lumbar Extension 10      Flexibility   Soft Tissue Assessment /Muscle Length yes    Hamstrings 30 degrees B                        Objective measurements completed on examination: See above findings.       [place] Adult PT Treatment/Exercise - 04/27/21 0001       Posture/Postural Control   Posture/Postural Control Postural limitations    Postural Limitations Forward head;Rounded Shoulders;Decreased lumbar lordosis      Therapeutic Activites    Therapeutic Activities  ADL's;Lifting;Other Therapeutic Activities    ADL's Log roll    Lifting Basic lift techniques  Other Therapeutic Activities Postural and spine anatomy education      Exercises   Exercises Lumbar      Lumbar Exercises: Stretches   Single Knee to Chest Stretch Left;Right;4 reps;20 seconds    Figure 4 Stretch 4 reps;20 seconds      Lumbar Exercises: Standing   Scapular Retraction Strengthening;Both;10 reps;Limitations    Scapular Retraction Limitations 5 seconds (SBP)    Other Standing Lumbar Exercises Standing trunk extension AROM 10X 3 seconds                    PT Education - 04/27/21 1720     Education Details Reviewed exam findings, basic posture, spine anatomy and body mechanics (log roll, seated and standing posture) and starter HEP    Person(s) Educated Patient    Methods Explanation;Demonstration;Verbal cues;Handout    Comprehension Verbal cues required;Need further instruction;Returned demonstration;Verbalized understanding              PT Short Term Goals - 04/27/21 1727       PT SHORT TERM GOAL #1   Title Cynthia Saunders will be independent with her starter HEP.    Time 4    Period Weeks    Status New    Target Date 05/25/21      PT SHORT TERM GOAL #2   Title Cynthia Saunders will report infrequent B leg pain below the level of the gluteal folds.    Baseline Can be distal to the knees.    Time 4    Period Weeks    Status New    Target Date 05/25/21               PT Long Term Goals - 04/27/21 1728       PT LONG TERM GOAL #1   Title Improve FOTO to 61 in 14-24 visits.    Baseline 36    Time 12    Period Weeks    Status New    Target Date 07/20/21      PT LONG TERM GOAL #2   Title Improve trunk extension AROM to 20 degrees.    Baseline 10 degrees.    Time 8    Period Weeks    Status New    Target Date 06/22/21      PT LONG TERM GOAL #3   Title Improve B hip flexibility for hip flexors to 100 and hamstrings to 45 degrees.    Baseline 80-85  and 35 degrees, respectively.    Time 12    Period Weeks    Status New    Target Date 07/20/21      PT LONG TERM GOAL #4   Title Improve spine strength as assessed by FOTO and subjective self-report.    Time 12    Period Weeks    Status New    Target Date 07/20/21      PT LONG TERM GOAL #5   Title Improve low back and B hip pain to consistently 0-4/10 on the Numeric Pain Rating Scale.    Baseline Can be 6+/10 and distal to the knees B.    Time 12    Period Weeks    Status New    Target Date 07/20/21                    Plan - 04/27/21 1722     Clinical Impression Statement Cynthia Saunders has had chronic back pain since a work injury in 2014 with a laminectomy/discectomy.  She is getting increased peripheral symptoms with her hips and down her legs.  She is very stiff and has poor postural strength and awareness.  With AROM, flexibility, strength, postural and body mechanics work, she should meet all LTGs.    Personal Factors and Comorbidities Time since onset of injury/illness/exacerbation;Comorbidity 3+;Fitness    Comorbidities Previous laminectomy/discectomy in 2014, HTN, breast cancer survivor, obesity    Examination-Activity Limitations Sit;Sleep;Bend;Lift;Squat;Locomotion Level;Stairs;Carry    Examination-Participation Restrictions Interpersonal Relationship;Occupation;Volunteer;Community Activity    Stability/Clinical Decision Making Stable/Uncomplicated    Clinical Decision Making Low    Rehab Potential Good    PT Frequency 2x / week    PT Duration 12 weeks    PT Treatment/Interventions ADLs/Self Care Home Management;Moist Heat;Cryotherapy;Traction;Therapeutic activities;Therapeutic exercise;Neuromuscular re-education;Patient/family education;Manual techniques;Dry needling    PT Next Visit Plan Progress hip stretching, postural and low back strengthening, practical body mechanics work.    PT Home Exercise Plan [id number]    Consulted and Agree with Plan of Care Patient              Patient will benefit from skilled therapeutic intervention in order to improve the following deficits and impairments:  Decreased activity tolerance, Decreased endurance, Decreased range of motion, Decreased strength, Difficulty walking, Impaired flexibility, Increased muscle spasms, Postural dysfunction, Improper body mechanics, Obesity, Pain  Visit Diagnosis: Difficulty walking  Abnormal posture  Chronic bilateral low back pain with left-sided sciatica  Chronic bilateral low back pain with right-sided sciatica  Muscle weakness (generalized)     Problem List Patient Active Problem List   Diagnosis Date Noted   Hypertension 02/04/2021   Morbid obesity ([place]) 02/04/2021   OSA (obstructive sleep apnea) 02/04/2021   NAFLD (nonalcoholic fatty liver disease) 02/04/2021   Sarcoidosis 10/25/2020   Status post lumbar microdiscectomy 11/15/2018   Impingement syndrome of right shoulder 05/21/2018   Degenerative lumbar disc 05/07/2018   HNP (herniated nucleus pulposus), lumbar 04/18/2013    Class: Diagnosis of   BRCA1 genetic carrier 01/27/2013   Breast cancer ([place]) 11/28/2007    Farley Ly PT, MPT 04/27/2021, 5:33 PM  [facility] [address] [place], [place], [address] Phone: [phone]   Fax:  [phone]  Name: Cynthia Saunders MRN: [id number] Date of Birth: 1970/11/23

## 2021-05-02 ENCOUNTER — Encounter: Payer: Self-pay | Admitting: Orthopaedic Surgery

## 2021-05-03 NOTE — Telephone Encounter (Signed)
Tisha-please see message from patient and Dr. Lorin Mercy below. Patient came in to see Dr. Erlinda Hong Ria Comment) for bilateral hip pain and Ria Comment thought that it was referred from her back. Her back is being covered by Work Comp. Patient has had one PT visit and was told her problems were related to her back. She would like to have the PT referral sent to and covered by Work Comp.

## 2021-05-11 ENCOUNTER — Ambulatory Visit (INDEPENDENT_AMBULATORY_CARE_PROVIDER_SITE_OTHER): Payer: Federal, State, Local not specified - PPO | Admitting: Physical Therapy

## 2021-05-11 ENCOUNTER — Encounter: Payer: Self-pay | Admitting: Physical Therapy

## 2021-05-11 ENCOUNTER — Other Ambulatory Visit: Payer: Self-pay

## 2021-05-11 DIAGNOSIS — R262 Difficulty in walking, not elsewhere classified: Secondary | ICD-10-CM

## 2021-05-11 DIAGNOSIS — G8929 Other chronic pain: Secondary | ICD-10-CM

## 2021-05-11 DIAGNOSIS — M5441 Lumbago with sciatica, right side: Secondary | ICD-10-CM

## 2021-05-11 DIAGNOSIS — M6281 Muscle weakness (generalized): Secondary | ICD-10-CM

## 2021-05-11 DIAGNOSIS — R293 Abnormal posture: Secondary | ICD-10-CM

## 2021-05-11 DIAGNOSIS — M5442 Lumbago with sciatica, left side: Secondary | ICD-10-CM

## 2021-05-11 NOTE — Patient Instructions (Signed)
Access Code: [email or web address] URL: [email or web address] Date: 05/11/2021 Prepared by: Almyra Free  Exercises Standing Scapular Retraction - 5 x daily - 7 x weekly - 1 sets - 5 reps - 5 second hold Standing Lumbar Extension at [place] - 5 x daily - 7 x weekly - 1 sets - 5 reps - 3 seconds hold Single Knee to Chest Stretch - 2-3 x daily - 7 x weekly - 1 sets - 5 reps - 20 seconds hold Supine Figure 4 Piriformis Stretch - 2-3 x daily - 7 x weekly - 1 sets - 5 reps - 20 seconds hold Clamshell with Resistance - 1 x daily - 3 x weekly - 1-3 sets - 10 reps Bridge with Hip Abduction and Resistance - 1 x daily - 3 x weekly - 1-3 sets - 10 reps Sidelying Hip Abduction - 1 x daily - 3 x weekly - 1-3 sets - 10 reps

## 2021-05-11 NOTE — Therapy (Signed)
[facility] [place], [place], [address] Phone: [phone]   Fax:  [phone]  Physical Therapy Treatment  Patient Details  Name: Cynthia Saunders MRN: [id number] Date of Birth: 1970-12-01 Referring Provider (PT): Aundra Dubin PA-C   Encounter Date: 05/11/2021   PT End of Session - 05/11/21 1156     Visit Number 2    Number of Visits 24    Date for PT Re-Evaluation 07/20/21    PT Start Time 0383    PT Stop Time 3383    PT Time Calculation (min) 35 min    Activity Tolerance Patient tolerated treatment well    Behavior During Therapy 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 01/27/2013   Breast cancer ([place]) 2009   triple negative left breast   Breast cancer ([place]) 11/28/2007   Left breast stage T3N1, triple negative   Depression    GERD (gastroesophageal reflux disease)    watches what she eats   Headache(784.0)    thinks due to prozac   Hypertension    during pregnancy 2002   Neuromuscular disorder ([place])    neuropathy legs started after chemo   Pneumonia    2010, post surgery   Pollen allergies    Sarcoidosis    Shortness of breath    Sleep apnea     Past Surgical History:  Procedure Laterality Date   ABDOMINAL HYSTERECTOMY     bilateral mastectomy Bilateral 2009   left sided breast cancer and prophylactic right   BREAST SURGERY     CESAREAN SECTION     x 2   COLONOSCOPY  2010   LUMBAR LAMINECTOMY Right 04/18/2013   Procedure: MICRODISCECTOMY LUMBAR LAMINECTOMY;  Surgeon: Marybelle Killings, MD;  Location: [facility];  Service: Orthopedics;  Laterality: Right;  Right L4-5 Microdiscectomy   MASTECTOMY     RECONSTRUCTION BREAST W/ TRAM FLAP Bilateral 2011   Done at [place]      There were no vitals filed for this visit.   Subjective Assessment - 05/11/21 1156     Subjective Patient  feeling somewhat better. Still in right hip.    Pertinent History Previous laminectomy/discectomy 04/2013, HTN, breast cancer survivor, obesity    Limitations Sitting;Reading;House hold activities;Lifting;Standing;Walking    Patient Stated Goals Be able to do more with less pain    Currently in Pain? Yes    Pain Score 6     Pain Location Hip    Pain Orientation Right    Pain Descriptors / Indicators Stabbing    Pain Type Chronic pain                               [facility] Adult PT Treatment/Exercise - 05/11/21 0001       Lumbar Exercises: Stretches   Single Knee to Chest Stretch Left;Right;20 seconds;2 reps    ITB Stretch Right;1 rep;60 seconds    ITB Stretch Limitations SDLY with leg off table; more for right QL and hip    Figure 4 Stretch 20 seconds;2 reps   bil   Figure 4 Stretch Limitations also done in siting    Other Lumbar Stretch Exercise self MFR using ball to lumbar and gluteals  Lumbar Exercises: Standing   Scapular Retraction Strengthening;Both;10 reps;Limitations    Scapular Retraction Limitations 5 seconds (SBP)    Other Standing Lumbar Exercises Standing trunk extension AROM 5 X 3 seconds      Lumbar Exercises: Supine   Bridge 10 reps;3 seconds    Bridge Limitations 2nd set with blue band    Bridge with clamshell 10 reps    Bridge with [employer] Limitations BTB      Lumbar Exercises: Sidelying   Clam Right;20 reps    Clam Limitations BTB    Hip Abduction Right;20 reps      Manual Therapy   Manual Therapy Joint mobilization;Myofascial release    Joint Mobilization Rt L5/S1 painful with grade I/II PA mobs    Myofascial Release attempted QL release but marked pain                    PT Education - 05/11/21 1232     Education Details HEP progressed; self MFR for lumbar and gluteal release    Person(s) Educated Patient    Methods Explanation;Demonstration;Handout    Comprehension Verbalized understanding;Returned  demonstration              PT Short Term Goals - 05/11/21 1238       PT SHORT TERM GOAL #1   Title Cynthia Saunders will be independent with her starter HEP.    Status On-going               PT Long Term Goals - 04/27/21 1728       PT LONG TERM GOAL #1   Title Improve FOTO to 61 in 14-24 visits.    Baseline 36    Time 12    Period Weeks    Status New    Target Date 07/20/21      PT LONG TERM GOAL #2   Title Improve trunk extension AROM to 20 degrees.    Baseline 10 degrees.    Time 8    Period Weeks    Status New    Target Date 06/22/21      PT LONG TERM GOAL #3   Title Improve B hip flexibility for hip flexors to 100 and hamstrings to 45 degrees.    Baseline 80-85 and 35 degrees, respectively.    Time 12    Period Weeks    Status New    Target Date 07/20/21      PT LONG TERM GOAL #4   Title Improve spine strength as assessed by FOTO and subjective self-report.    Time 12    Period Weeks    Status New    Target Date 07/20/21      PT LONG TERM GOAL #5   Title Improve low back and B hip pain to consistently 0-4/10 on the Numeric Pain Rating Scale.    Baseline Can be 6+/10 and distal to the knees B.    Time 12    Period Weeks    Status New    Target Date 07/20/21                   Plan - 05/11/21 1236     Clinical Impression Statement Patient 10 min late. She presented with pain in the right hip today. Hip mobility has improved since eval. Good tolerance to TE today. Manual was very painful, however she could tolerate self massage with ball.    Personal Factors and Comorbidities Time since onset of injury/illness/exacerbation;Comorbidity 3+;Fitness  Comorbidities Previous laminectomy/discectomy in 2014, HTN, breast cancer survivor, obesity    Examination-Activity Limitations Sit;Sleep;Bend;Lift;Squat;Locomotion Level;Stairs;Carry    PT Frequency 2x / week    PT Duration 12 weeks    PT Treatment/Interventions ADLs/Self Care Home Management;Moist  Heat;Cryotherapy;Traction;Therapeutic activities;Therapeutic exercise;Neuromuscular re-education;Patient/family education;Manual techniques;Dry needling    PT Next Visit Plan Progress hip stretching, postural and low back strengthening, practical body mechanics work.    PT Home Exercise Plan [email or web address]    Consulted and Agree with Plan of Care Patient             Patient will benefit from skilled therapeutic intervention in order to improve the following deficits and impairments:  Decreased activity tolerance, Decreased endurance, Decreased range of motion, Decreased strength, Difficulty walking, Impaired flexibility, Increased muscle spasms, Postural dysfunction, Improper body mechanics, Obesity, Pain  Visit Diagnosis: Muscle weakness (generalized)  Chronic bilateral low back pain with right-sided sciatica  Difficulty walking  Abnormal posture  Chronic bilateral low back pain with left-sided sciatica     Problem List Patient Active Problem List   Diagnosis Date Noted   Hypertension 02/04/2021   Morbid obesity ([place]) 02/04/2021   OSA (obstructive sleep apnea) 02/04/2021   NAFLD (nonalcoholic fatty liver disease) 02/04/2021   Sarcoidosis 10/25/2020   Status post lumbar microdiscectomy 11/15/2018   Impingement syndrome of right shoulder 05/21/2018   Degenerative lumbar disc 05/07/2018   HNP (herniated nucleus pulposus), lumbar 04/18/2013    Class: Diagnosis of   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 [place], [place], [address] Phone: [phone]   Fax:  [phone]  Name: Cynthia Saunders MRN: [id number] Date of Birth: May 24, 1971

## 2021-05-16 ENCOUNTER — Other Ambulatory Visit: Payer: Self-pay

## 2021-05-16 ENCOUNTER — Encounter: Payer: Self-pay | Admitting: Rehabilitative and Restorative Service Providers"

## 2021-05-16 ENCOUNTER — Ambulatory Visit (INDEPENDENT_AMBULATORY_CARE_PROVIDER_SITE_OTHER): Payer: Federal, State, Local not specified - PPO | Admitting: Rehabilitative and Restorative Service Providers"

## 2021-05-16 DIAGNOSIS — M5442 Lumbago with sciatica, left side: Secondary | ICD-10-CM

## 2021-05-16 DIAGNOSIS — M5441 Lumbago with sciatica, right side: Secondary | ICD-10-CM | POA: Diagnosis not present

## 2021-05-16 DIAGNOSIS — M6281 Muscle weakness (generalized): Secondary | ICD-10-CM

## 2021-05-16 DIAGNOSIS — R262 Difficulty in walking, not elsewhere classified: Secondary | ICD-10-CM | POA: Diagnosis not present

## 2021-05-16 DIAGNOSIS — G8929 Other chronic pain: Secondary | ICD-10-CM

## 2021-05-16 DIAGNOSIS — R293 Abnormal posture: Secondary | ICD-10-CM | POA: Diagnosis not present

## 2021-05-16 NOTE — Therapy (Signed)
[facility] [address] [place], [place], [address] Phone: [phone]   Fax:  [phone]  Physical Therapy Treatment  Patient Details  Name: Cynthia Saunders MRN: [id number] Date of Birth: 08/12/1971 Referring Provider (PT): Aundra Dubin PA-C   Encounter Date: 05/16/2021   PT End of Session - 05/16/21 1001     Visit Number 3    Number of Visits 24    Date for PT Re-Evaluation 07/20/21    Authorization Type [employer]    Authorization Time Period 04/27/2021-06/17/2021    Authorization - Visit Number 3    Authorization - Number of Visits 12    Progress Note Due on Visit 10    PT Start Time 1003    PT Stop Time 7494    PT Time Calculation (min) 39 min    Activity Tolerance Patient tolerated treatment well    Behavior During Therapy [facility] for tasks assessed/performed             Past Medical History:  Diagnosis Date   Anxiety    Arthritis    Asthma    BRCA1 genetic carrier 01/27/2013   Breast cancer ([place]) 2009   triple negative left breast   Breast cancer ([place]) 11/28/2007   Left breast stage T3N1, triple negative   Depression    GERD (gastroesophageal reflux disease)    watches what she eats   Headache(784.0)    thinks due to prozac   Hypertension    during pregnancy 2002   Neuromuscular disorder ([place])    neuropathy legs started after chemo   Pneumonia    2010, post surgery   Pollen allergies    Sarcoidosis    Shortness of breath    Sleep apnea     Past Surgical History:  Procedure Laterality Date   ABDOMINAL HYSTERECTOMY     bilateral mastectomy Bilateral 2009   left sided breast cancer and prophylactic right   BREAST SURGERY     CESAREAN SECTION     x 2   COLONOSCOPY  2010   LUMBAR LAMINECTOMY Right 04/18/2013   Procedure: MICRODISCECTOMY LUMBAR LAMINECTOMY;  Surgeon: Marybelle Killings, MD;  Location: [facility];  Service: Orthopedics;  Laterality: Right;  Right L4-5 Microdiscectomy   MASTECTOMY      RECONSTRUCTION BREAST W/ TRAM FLAP Bilateral 2011   Done at [place]      There were no vitals filed for this visit.   Subjective Assessment - 05/16/21 1003     Subjective Pt. indicated feeling better today. Pt. indicated 2/10 or so for Rt hip as most obvious complaint.    Pertinent History Previous laminectomy/discectomy 04/2013, HTN, breast cancer survivor, obesity    Limitations Sitting;Reading;House hold activities;Lifting;Standing;Walking    Patient Stated Goals Be able to do more with less pain    Currently in Pain? Yes    Pain Score 2     Pain Location Hip    Pain Orientation Right    Pain Type Chronic pain    Pain Onset More than a month ago    Pain Frequency Intermittent    Aggravating Factors  random quick pains in walking (example of stepping off curb)    Pain Relieving Factors tennis ball pressure helped                [facility] PT Assessment - 05/16/21 0001       Assessment   Medical Diagnosis bilateral hip pain  Referring Provider (PT) Aundra Dubin PA-C      AROM   Lumbar Extension 50% WFL c no pain complaints.                           [facility] Adult PT Treatment/Exercise - 05/16/21 0001       Lumbar Exercises: Stretches   Single Knee to Chest Stretch 20 seconds;3 reps;Left;Right   supine   Figure 4 Stretch 20 seconds;3 reps;Supine   bilateral into flexion     Lumbar Exercises: Aerobic   Nustep Lvl 6 10 mins UE/LE for aerobic exercise (cues for use at home)      Lumbar Exercises: Standing   Other Standing Lumbar Exercises Standing trunk extension AROM 5 X 3 seconds      Lumbar Exercises: Seated   Sit to Stand Other (comment);20 reps   18 inch table, no UE, slow eccentrics     Lumbar Exercises: Supine   Bridge Other (comment)   3x 10 blue band around knees hip abd hold     Lumbar Exercises: Sidelying   Clam Left   3 x 10, blue band bilateral                     PT Short Term Goals - 05/11/21  1238       PT SHORT TERM GOAL #1   Title Willeen will be independent with her starter HEP.    Status On-going               PT Long Term Goals - 04/27/21 1728       PT LONG TERM GOAL #1   Title Improve FOTO to 61 in 14-24 visits.    Baseline 36    Time 12    Period Weeks    Status New    Target Date 07/20/21      PT LONG TERM GOAL #2   Title Improve trunk extension AROM to 20 degrees.    Baseline 10 degrees.    Time 8    Period Weeks    Status New    Target Date 06/22/21      PT LONG TERM GOAL #3   Title Improve B hip flexibility for hip flexors to 100 and hamstrings to 45 degrees.    Baseline 80-85 and 35 degrees, respectively.    Time 12    Period Weeks    Status New    Target Date 07/20/21      PT LONG TERM GOAL #4   Title Improve spine strength as assessed by FOTO and subjective self-report.    Time 12    Period Weeks    Status New    Target Date 07/20/21      PT LONG TERM GOAL #5   Title Improve low back and B hip pain to consistently 0-4/10 on the Numeric Pain Rating Scale.    Baseline Can be 6+/10 and distal to the knees B.    Time 12    Period Weeks    Status New    Target Date 07/20/21                   Plan - 05/16/21 1021     Clinical Impression Statement Pt. continued to report overall progression in reduction of severity of symptoms from hips.  Rt hip was noted in subjective data.  Continued progression today to continue to improve mobility and strength related  to back/hips for improved functional mobility.    Personal Factors and Comorbidities Time since onset of injury/illness/exacerbation;Comorbidity 3+;Fitness    Comorbidities Previous laminectomy/discectomy in 2014, HTN, breast cancer survivor    Examination-Activity Limitations Sit;Sleep;Bend;Lift;Squat;Locomotion Level;Stairs;Carry    Examination-Participation Restrictions Interpersonal Relationship;Occupation;Volunteer;Community Activity    Stability/Clinical Decision  Making Stable/Uncomplicated    Clinical Decision Making Low    Rehab Potential Good    PT Frequency 2x / week    PT Duration 12 weeks    PT Treatment/Interventions ADLs/Self Care Home Management;Moist Heat;Cryotherapy;Traction;Therapeutic activities;Therapeutic exercise;Neuromuscular re-education;Patient/family education;Manual techniques;Dry needling    PT Next Visit Plan Hip/LE strengthening, continued lumbar/hip mobility gains    PT Home Exercise Plan [email or web address]    Consulted and Agree with Plan of Care Patient             Patient will benefit from skilled therapeutic intervention in order to improve the following deficits and impairments:  Decreased activity tolerance, Decreased endurance, Decreased range of motion, Decreased strength, Difficulty walking, Impaired flexibility, Increased muscle spasms, Postural dysfunction, Improper body mechanics, Obesity, Pain  Visit Diagnosis: Difficulty walking  Abnormal posture  Chronic bilateral low back pain with right-sided sciatica  Chronic bilateral low back pain with left-sided sciatica  Muscle weakness (generalized)     Problem List Patient Active Problem List   Diagnosis Date Noted   Hypertension 02/04/2021   Morbid obesity ([facility]) 02/04/2021   OSA (obstructive sleep apnea) 02/04/2021   NAFLD (nonalcoholic fatty liver disease) 02/04/2021   Sarcoidosis 10/25/2020   Status post lumbar microdiscectomy 11/15/2018   Impingement syndrome of right shoulder 05/21/2018   Degenerative lumbar disc 05/07/2018   HNP (herniated nucleus pulposus), lumbar 04/18/2013    Class: Diagnosis of   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    

## 2021-05-18 ENCOUNTER — Encounter: Payer: Federal, State, Local not specified - PPO | Admitting: Rehabilitative and Restorative Service Providers"

## 2021-05-26 ENCOUNTER — Ambulatory Visit (INDEPENDENT_AMBULATORY_CARE_PROVIDER_SITE_OTHER): Payer: Federal, State, Local not specified - PPO | Admitting: Rehabilitative and Restorative Service Providers"

## 2021-05-26 ENCOUNTER — Other Ambulatory Visit: Payer: Self-pay

## 2021-05-26 ENCOUNTER — Encounter: Payer: Self-pay | Admitting: Rehabilitative and Restorative Service Providers"

## 2021-05-26 DIAGNOSIS — R293 Abnormal posture: Secondary | ICD-10-CM

## 2021-05-26 DIAGNOSIS — M5441 Lumbago with sciatica, right side: Secondary | ICD-10-CM | POA: Diagnosis not present

## 2021-05-26 DIAGNOSIS — M6281 Muscle weakness (generalized): Secondary | ICD-10-CM

## 2021-05-26 DIAGNOSIS — R262 Difficulty in walking, not elsewhere classified: Secondary | ICD-10-CM

## 2021-05-26 DIAGNOSIS — G8929 Other chronic pain: Secondary | ICD-10-CM

## 2021-05-26 DIAGNOSIS — M5442 Lumbago with sciatica, left side: Secondary | ICD-10-CM | POA: Diagnosis not present

## 2021-05-26 NOTE — Patient Instructions (Signed)
Access Code: [email or web address] URL: [email or web address] Date: 05/26/2021 Prepared by: Vista Mink  Exercises Standing Scapular Retraction - 5 x daily - 7 x weekly - 1 sets - 5 reps - 5 second hold Standing Lumbar Extension at [place] - 5 x daily - 7 x weekly - 1 sets - 5 reps - 3 seconds hold Single Knee to Chest Stretch - 2-3 x daily - 7 x weekly - 1 sets - 5 reps - 20 seconds hold Supine Figure 4 Piriformis Stretch - 2-3 x daily - 7 x weekly - 1 sets - 5 reps - 20 seconds hold Clamshell with Resistance - 1 x daily - 3 x weekly - 1-3 sets - 10 reps Bridge with Hip Abduction and Resistance - 1 x daily - 3 x weekly - 1-3 sets - 10 reps Sidelying Hip Abduction - 1 x daily - 3 x weekly - 1-3 sets - 10 reps Prone Hip Extension - 1 x daily - 7 x weekly - 2-3 sets - 10 reps - 3 seconds hold Standing Hip Hiking - 2 x daily - 7 x weekly - 2 sets - 10 reps - 3 seconds hold

## 2021-05-26 NOTE — Therapy (Signed)
[facility] [address] [place], [place], [address] Phone: [phone]   Fax:  [phone]  Physical Therapy Treatment/Reassessment  Patient Details  Name: Cynthia Saunders MRN: [id number] Date of Birth: 1970-09-20 Referring Provider (PT): Aundra Dubin PA-C   Encounter Date: 05/26/2021   PT End of Session - 05/26/21 1656     Visit Number 4    Number of Visits 24    Date for PT Re-Evaluation 07/20/21    Authorization Type [employer]    Authorization Time Period 04/27/2021-06/17/2021    Authorization - Visit Number 4    Authorization - Number of Visits 12    Progress Note Due on Visit 10    PT Start Time 9357    PT Stop Time 1230    PT Time Calculation (min) 45 min    Activity Tolerance Patient tolerated treatment well    Behavior During Therapy [facility] for tasks assessed/performed             Past Medical History:  Diagnosis Date   Anxiety    Arthritis    Asthma    BRCA1 genetic carrier 01/27/2013   Breast cancer ([place]) 2009   triple negative left breast   Breast cancer ([place]) 11/28/2007   Left breast stage T3N1, triple negative   Depression    GERD (gastroesophageal reflux disease)    watches what she eats   Headache(784.0)    thinks due to prozac   Hypertension    during pregnancy 2002   Neuromuscular disorder ([place])    neuropathy legs started after chemo   Pneumonia    2010, post surgery   Pollen allergies    Sarcoidosis    Shortness of breath    Sleep apnea     Past Surgical History:  Procedure Laterality Date   ABDOMINAL HYSTERECTOMY     bilateral mastectomy Bilateral 2009   left sided breast cancer and prophylactic right   BREAST SURGERY     CESAREAN SECTION     x 2   COLONOSCOPY  2010   LUMBAR LAMINECTOMY Right 04/18/2013   Procedure: MICRODISCECTOMY LUMBAR LAMINECTOMY;  Surgeon: Marybelle Killings, MD;  Location: [place];  Service: Orthopedics;  Laterality: Right;  Right L4-5 Microdiscectomy   MASTECTOMY      RECONSTRUCTION BREAST W/ TRAM FLAP Bilateral 2011   Done at [place]      There were no vitals filed for this visit.   Subjective Assessment - 05/26/21 1209     Subjective Cynthia Saunders notes stairs have improved since starting PT.  She also notes better flexibility, posture and body mechanics.  Low back pain has improved but is still limiting.  Scaitica to the knee is improved (less frequent and intense) but still present.    Pertinent History Previous laminectomy/discectomy 04/2013, HTN, breast cancer survivor, obesity    Limitations Sitting;Reading;House hold activities;Lifting;Standing;Walking    How long can you sit comfortably? 30 minutes    How long can you stand comfortably? 30 minutes    How long can you walk comfortably? 1 hour    Patient Stated Goals Be able to do more with less pain    Currently in Pain? Yes    Pain Score 3     Pain Location Hip    Pain Orientation Right    Pain Descriptors / Indicators Stabbing    Pain Type Chronic pain    Pain Radiating Towards B hips and R LE to the knee (  was both LE)    Pain Onset More than a month ago    Pain Frequency Intermittent    Aggravating Factors  Sitting too long, prolonged postures, flexion    Pain Relieving Factors Exercises and postural correction    Effect of Pain on Daily Activities Out of work, endurance is not where she would like it    Multiple Pain Sites No                [facility] PT Assessment - 05/26/21 0001       Observation/Other Assessments   Focus on Therapeutic Outcomes (FOTO)  56 (was 36, Goal 61 at visit 14)      AROM   Lumbar Extension 20                           [facility] Adult PT Treatment/Exercise - 05/26/21 0001       Posture/Postural Control   Posture/Postural Control Postural limitations    Postural Limitations Forward head;Rounded Shoulders;Decreased lumbar lordosis      Therapeutic Activites    Therapeutic Activities ADL's;Lifting    ADL's Golfer's  lift and diagonal squat lift for laundry and house chores, bed mobility, lifting grandchild, review disc pressures in various postures      Exercises   Exercises Lumbar      Lumbar Exercises: Stretches   Single Knee to Chest Stretch Left;Right;4 reps;20 seconds    Figure 4 Stretch 4 reps;20 seconds      Lumbar Exercises: Standing   Scapular Retraction Strengthening;Both;10 reps;Limitations    Scapular Retraction Limitations 5 seconds (SBP)    Other Standing Lumbar Exercises Standing trunk extension AROM 10X 3 seconds    Other Standing Lumbar Exercises Alternating hip hike in doorframe 2 sets of 10 3 seconds      Lumbar Exercises: Seated   Sit to Stand 10 reps;Limitations    Sit to Stand Limitations slow eccentrics      Lumbar Exercises: Prone   Straight Leg Raise 10 reps;3 seconds   2 sets                    PT Education - 05/26/21 1652     Education Details Spent a lot of time on Clinical cytogeneticist for house chores.  Progressed core strength.    Person(s) Educated Patient    Methods Explanation;Demonstration;Verbal cues;Handout    Comprehension Verbal cues required;Need further instruction;Returned demonstration;Verbalized understanding              PT Short Term Goals - 05/26/21 1653       PT SHORT TERM GOAL #1   Title Cynthia Saunders will be independent with her starter HEP.    Status Achieved      PT SHORT TERM GOAL #2   Title Cynthia Saunders will report infrequent B leg pain below the level of the gluteal folds.    Baseline Can be distal to the knees.    Status Achieved               PT Long Term Goals - 05/26/21 1653       PT LONG TERM GOAL #1   Title Improve FOTO to 61 in 14-24 visits.    Baseline 56, was 36 at evaluation    Time 8    Period Weeks    Status New    Target Date 07/21/21      PT LONG TERM GOAL #2   Title Improve  trunk extension AROM to 20 degrees.    Baseline 10 degrees at evaluation    Time --    Period --    Status  Achieved      PT LONG TERM GOAL #3   Title Improve B hip flexibility for hip flexors to 100 and hamstrings to 45 degrees.    Baseline 80-85 and 35 degrees, respectively.    Time 8    Period Weeks    Status On-going    Target Date 07/21/21      PT LONG TERM GOAL #4   Title Improve spine strength as assessed by FOTO and subjective self-report.    Time 8    Period Weeks    Status On-going    Target Date 07/21/21      PT LONG TERM GOAL #5   Title Improve low back and B hip pain to consistently 0-4/10 on the Numeric Pain Rating Scale.    Baseline Was 6+/10 and distal to the knees B.    Time 8    Period Weeks    Status On-going    Target Date 07/21/21                   Plan - 05/26/21 1656     Clinical Impression Statement Cynthia Saunders is making good early progress towards LTGs.  Although improved, pain remains functionally limiting and will continue to benefit from the recommended course of physical therapy.  Continue strength and body mechanics progressions to meet all LTGs and prepare Cynthia Saunders for transfer into independent rehabilitation.    Personal Factors and Comorbidities Time since onset of injury/illness/exacerbation;Comorbidity 3+;Fitness    Comorbidities Previous laminectomy/discectomy in 2014, HTN, breast cancer survivor    Examination-Activity Limitations Sit;Sleep;Bend;Lift;Squat;Locomotion Level;Stairs;Carry    Examination-Participation Restrictions Interpersonal Relationship;Occupation;Volunteer;Community Activity    Stability/Clinical Decision Making Stable/Uncomplicated    Rehab Potential Good    PT Frequency 2x / week    PT Duration 8 weeks    PT Treatment/Interventions ADLs/Self Care Home Management;Moist Heat;Cryotherapy;Traction;Therapeutic activities;Therapeutic exercise;Neuromuscular re-education;Patient/family education;Manual techniques;Dry needling    PT Next Visit Plan Low back strength and practical body mechanics    PT Home Exercise Plan [email or web address]     Consulted and Agree with Plan of Care Patient             Patient will benefit from skilled therapeutic intervention in order to improve the following deficits and impairments:  Decreased activity tolerance, Decreased endurance, Decreased range of motion, Decreased strength, Difficulty walking, Impaired flexibility, Increased muscle spasms, Postural dysfunction, Improper body mechanics, Obesity, Pain  Visit Diagnosis: Difficulty walking  Abnormal posture  Chronic bilateral low back pain with right-sided sciatica  Chronic bilateral low back pain with left-sided sciatica  Muscle weakness (generalized)     Problem List Patient Active Problem List   Diagnosis Date Noted   Hypertension 02/04/2021   Morbid obesity ([place]) 02/04/2021   OSA (obstructive sleep apnea) 02/04/2021   NAFLD (nonalcoholic fatty liver disease) 02/04/2021   Sarcoidosis 10/25/2020   Status post lumbar microdiscectomy 11/15/2018   Impingement syndrome of right shoulder 05/21/2018   Degenerative lumbar disc 05/07/2018   HNP (herniated nucleus pulposus), lumbar 04/18/2013    Class: Diagnosis of   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 [place], [place], [address] Phone: [phone]   Fax:  [phone]  Name: Cynthia Saunders MRN: [id number] Date of  Birth: Jul 28, 1971

## 2021-06-01 ENCOUNTER — Other Ambulatory Visit: Payer: Self-pay

## 2021-06-01 ENCOUNTER — Ambulatory Visit (INDEPENDENT_AMBULATORY_CARE_PROVIDER_SITE_OTHER): Payer: Federal, State, Local not specified - PPO | Admitting: Rehabilitative and Restorative Service Providers"

## 2021-06-01 ENCOUNTER — Encounter: Payer: Self-pay | Admitting: Rehabilitative and Restorative Service Providers"

## 2021-06-01 DIAGNOSIS — R262 Difficulty in walking, not elsewhere classified: Secondary | ICD-10-CM

## 2021-06-01 DIAGNOSIS — R293 Abnormal posture: Secondary | ICD-10-CM

## 2021-06-01 DIAGNOSIS — M5441 Lumbago with sciatica, right side: Secondary | ICD-10-CM | POA: Diagnosis not present

## 2021-06-01 DIAGNOSIS — M5442 Lumbago with sciatica, left side: Secondary | ICD-10-CM

## 2021-06-01 DIAGNOSIS — M6281 Muscle weakness (generalized): Secondary | ICD-10-CM | POA: Diagnosis not present

## 2021-06-01 DIAGNOSIS — G8929 Other chronic pain: Secondary | ICD-10-CM

## 2021-06-01 NOTE — Therapy (Signed)
[facility] [address] [place], [place], [address] Phone: [phone]   Fax:  [phone]  Physical Therapy Treatment  Patient Details  Name: Cynthia Saunders MRN: [id number] Date of Birth: [birth year] Referring Provider (PT): Aundra Dubin PA-C   Encounter Date: 06/01/2021   PT End of Session - 06/01/21 1216     Visit Number 5    Number of Visits 24    Date for PT Re-Evaluation 07/20/21    Authorization Type [employer]    Authorization Time Period 04/27/2021-06/17/2021    Authorization - Visit Number 5    Authorization - Number of Visits 12    Progress Note Due on Visit 10    PT Start Time 1148    PT Stop Time 1226    PT Time Calculation (min) 38 min    Activity Tolerance Patient tolerated treatment well    Behavior During Therapy [facility] for tasks assessed/performed             Past Medical History:  Diagnosis Date   Anxiety    Arthritis    Asthma    BRCA1 genetic carrier 01/27/2013   Breast cancer ([place]) 2009   triple negative left breast   Breast cancer ([place]) 11/28/2007   Left breast stage T3N1, triple negative   Depression    GERD (gastroesophageal reflux disease)    watches what she eats   Headache(784.0)    thinks due to prozac   Hypertension    during pregnancy 2002   Neuromuscular disorder ([place])    neuropathy legs started after chemo   Pneumonia    2010, post surgery   Pollen allergies    Sarcoidosis    Shortness of breath    Sleep apnea     Past Surgical History:  Procedure Laterality Date   ABDOMINAL HYSTERECTOMY     bilateral mastectomy Bilateral 2009   left sided breast cancer and prophylactic right   BREAST SURGERY     CESAREAN SECTION     x 2   COLONOSCOPY  2010   LUMBAR LAMINECTOMY Right 04/18/2013   Procedure: MICRODISCECTOMY LUMBAR LAMINECTOMY;  Surgeon: Marybelle Killings, MD;  Location: [place];  Service: Orthopedics;  Laterality: Right;  Right L4-5 Microdiscectomy   MASTECTOMY      RECONSTRUCTION BREAST W/ TRAM FLAP Bilateral 2011   Done at [place]      There were no vitals filed for this visit.   Subjective Assessment - 06/01/21 1151     Subjective Pt. indicated 3/10 back symptoms today.  Noticed heavier lifting hurts more for back.  Did report stretching at home and movement helps relieve back pain at times.    Pertinent History Previous laminectomy/discectomy 04/2013, HTN, breast cancer survivor, obesity    Limitations Sitting;Reading;House hold activities;Lifting;Standing;Walking    Patient Stated Goals Be able to do more with less pain    Currently in Pain? Yes    Pain Score 3     Pain Location Back    Pain Orientation Lower    Pain Descriptors / Indicators Stabbing    Pain Type Chronic pain    Pain Onset More than a month ago    Pain Frequency Intermittent    Aggravating Factors  lifting    Pain Relieving Factors stretching helps  [place] Adult PT Treatment/Exercise - 06/01/21 0001       Self-Care   Self-Care Other Self-Care Comments    Other Self-Care Comments  Discussion provided about return to gym activity and trainin gprogram recommendations ( lower weight, higher reps, aerobic interventions such as bike).  Discussed c patient that adaptions to activity (ie TKE in leg extension, squats, etc) may be necessary in short term due to any aggravating symptoms but in lower term she could return to full ranges/activities as symptoms improve and function improves.      Exercises   Exercises Other Exercises    Other Exercises  Verbal review of existing HEP, left out performance due to knowledge      Lumbar Exercises: Aerobic   Recumbent Bike Lvl 3 10 mins      Lumbar Exercises: Machines for Strengthening   Cybex Knee Extension Double leg 15 lbs slow lowering x 15 (cues for use at gym c positioning and avoiding TKE 15 degrees if painful in knees)    Leg Press DL 2 x 10 75 lbs, SL 2 x  10 31 lbs bilateral      Lumbar Exercises: Standing   Scapular Retraction Limitations verbal review for HEP    Other Standing Lumbar Exercises standing lumbar extension x 10    Other Standing Lumbar Exercises hip hike in doorframe 3 sec hold x 10 bilateral (cues for review)                     PT Education - 06/01/21 1232     Education Details See self care    Person(s) Educated Patient    Methods Explanation;Demonstration;Verbal cues    Comprehension Verbalized understanding;Returned demonstration              PT Short Term Goals - 05/26/21 1653       PT SHORT TERM GOAL #1   Title Melinna will be independent with her starter HEP.    Status Achieved      PT SHORT TERM GOAL #2   Title Layna will report infrequent B leg pain below the level of the gluteal folds.    Baseline Can be distal to the knees.    Status Achieved               PT Long Term Goals - 05/26/21 1653       PT LONG TERM GOAL #1   Title Improve FOTO to 61 in 14-24 visits.    Baseline 56, was 36 at evaluation    Time 8    Period Weeks    Status New    Target Date 07/21/21      PT LONG TERM GOAL #2   Title Improve trunk extension AROM to 20 degrees.    Baseline 10 degrees at evaluation    Time --    Period --    Status Achieved      PT LONG TERM GOAL #3   Title Improve B hip flexibility for hip flexors to 100 and hamstrings to 45 degrees.    Baseline 80-85 and 35 degrees, respectively.    Time 8    Period Weeks    Status On-going    Target Date 07/21/21      PT LONG TERM GOAL #4   Title Improve spine strength as assessed by FOTO and subjective self-report.    Time 8    Period Weeks    Status On-going    Target Date 07/21/21  PT LONG TERM GOAL #5   Title Improve low back and B hip pain to consistently 0-4/10 on the Numeric Pain Rating Scale.    Baseline Was 6+/10 and distal to the knees B.    Time 8    Period Weeks    Status On-going    Target Date 07/21/21                    Plan - 06/01/21 1159     Clinical Impression Statement Discussed c Pt. about improvements to this point and whether she would like to trial HEP or continue in clinic.  Pt. indicated desire to schedule for 3 weeks from now to trial use of HEP for long term management. Reviewed use of HEP for lumbar mobility management and symptom relief as noted in clinic today.    Personal Factors and Comorbidities Time since onset of injury/illness/exacerbation;Comorbidity 3+;Fitness    Comorbidities Previous laminectomy/discectomy in 2014, HTN, breast cancer survivor    Examination-Activity Limitations Sit;Sleep;Bend;Lift;Squat;Locomotion Level;Stairs;Carry    Examination-Participation Restrictions Interpersonal Relationship;Occupation;Volunteer;Community Activity    Stability/Clinical Decision Making Stable/Uncomplicated    Rehab Potential Good    PT Frequency 2x / week    PT Duration 8 weeks    PT Treatment/Interventions ADLs/Self Care Home Management;Moist Heat;Cryotherapy;Traction;Therapeutic activities;Therapeutic exercise;Neuromuscular re-education;Patient/family education;Manual techniques;Dry needling    PT Next Visit Plan Review HEP and trial HEP usage over the 3 weeks.    PT Home Exercise Plan [email or web address]    Consulted and Agree with Plan of Care Patient             Patient will benefit from skilled therapeutic intervention in order to improve the following deficits and impairments:  Decreased activity tolerance, Decreased endurance, Decreased range of motion, Decreased strength, Difficulty walking, Impaired flexibility, Increased muscle spasms, Postural dysfunction, Improper body mechanics, Obesity, Pain  Visit Diagnosis: Muscle weakness (generalized)  Chronic bilateral low back pain with right-sided sciatica  Difficulty walking  Abnormal posture  Chronic bilateral low back pain with left-sided sciatica     Problem List Patient Active Problem List    Diagnosis Date Noted   Hypertension 02/04/2021   Morbid obesity ([place]) 02/04/2021   OSA (obstructive sleep apnea) 02/04/2021   NAFLD (nonalcoholic fatty liver disease) 02/04/2021   Sarcoidosis 10/25/2020   Status post lumbar microdiscectomy 11/15/2018   Impingement syndrome of right shoulder 05/21/2018   Degenerative lumbar disc 05/07/2018   HNP (herniated nucleus pulposus), lumbar 04/18/2013    Class: Diagnosis of   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 [place], [place], [address] Phone: [phone]   Fax:  [phone]  Name: Cynthia Saunders MRN: [id number] Date of Birth: Aug 02, 1971

## 2021-06-17 ENCOUNTER — Encounter: Payer: Federal, State, Local not specified - PPO | Admitting: Rehabilitative and Restorative Service Providers"

## 2021-06-21 ENCOUNTER — Encounter: Payer: Federal, State, Local not specified - PPO | Admitting: Rehabilitative and Restorative Service Providers"

## 2021-06-28 ENCOUNTER — Ambulatory Visit (INDEPENDENT_AMBULATORY_CARE_PROVIDER_SITE_OTHER): Payer: Federal, State, Local not specified - PPO | Admitting: Rehabilitative and Restorative Service Providers"

## 2021-06-28 ENCOUNTER — Encounter: Payer: Self-pay | Admitting: Rehabilitative and Restorative Service Providers"

## 2021-06-28 ENCOUNTER — Other Ambulatory Visit: Payer: Self-pay

## 2021-06-28 DIAGNOSIS — M6281 Muscle weakness (generalized): Secondary | ICD-10-CM

## 2021-06-28 DIAGNOSIS — G8929 Other chronic pain: Secondary | ICD-10-CM

## 2021-06-28 DIAGNOSIS — R293 Abnormal posture: Secondary | ICD-10-CM | POA: Diagnosis not present

## 2021-06-28 DIAGNOSIS — M5441 Lumbago with sciatica, right side: Secondary | ICD-10-CM

## 2021-06-28 DIAGNOSIS — M5442 Lumbago with sciatica, left side: Secondary | ICD-10-CM | POA: Diagnosis not present

## 2021-06-28 DIAGNOSIS — R262 Difficulty in walking, not elsewhere classified: Secondary | ICD-10-CM | POA: Diagnosis not present

## 2021-06-28 NOTE — Patient Instructions (Signed)
Access Code: [email or web address] URL: [email or web address] Date: 06/28/2021 Prepared by: Scot Jun  Exercises Standing Scapular Retraction - 5 x daily - 7 x weekly - 1 sets - 5 reps - 5 second hold Standing Lumbar Extension at [place] - 5 x daily - 7 x weekly - 1 sets - 5 reps - 3 seconds hold Single Knee to Chest Stretch - 2-3 x daily - 7 x weekly - 1 sets - 5 reps - 20 seconds hold Supine Figure 4 Piriformis Stretch - 2-3 x daily - 7 x weekly - 1 sets - 5 reps - 20 seconds hold Bridge with Hip Abduction and Resistance - 1 x daily - 3 x weekly - 1-3 sets - 10 reps Sidelying Hip Abduction - 1 x daily - 3 x weekly - 1-3 sets - 10 reps Standing Hip Hiking - 2 x daily - 7 x weekly - 2 sets - 10 reps - 3 seconds hold Supine 90/90 Sciatic Nerve Glide with Knee Flexion/Extension - 2 x daily - 7 x weekly - 3 sets - 10 reps

## 2021-06-28 NOTE — Therapy (Addendum)
[facility], [place], [address] Phone: [phone]   Fax:  [phone]  Physical Therapy Treatment/Recertification/ Discharge   Patient Details  Name: Cynthia Saunders MRN: [id number] Date of Birth: 29-Aug-1971 Referring Provider (PT): Aundra Dubin PA-C   Encounter Date: 06/28/2021  Progress Note Reporting Period 04/27/2021 to 06/28/2021  See note below for Objective Data and Assessment of Progress/Goals.       PT End of Session - 06/28/21 1039     Visit Number 6    Number of Visits 13    Date for PT Re-Evaluation 08/23/21    Authorization Type BCBS, [place] [employer] - new humana cert submitted today    Authorization Time Period 04/27/2021-06/17/2021 originally    Authorization - Visit Number --    Authorization - Number of Visits --    Progress Note Due on Visit 16    PT Start Time 1029    PT Stop Time 1054    PT Time Calculation (min) 25 min    Activity Tolerance Patient tolerated treatment well    Behavior During Therapy WFL for tasks assessed/performed             Past Medical History:  Diagnosis Date   Anxiety    Arthritis    Asthma    BRCA1 genetic carrier 01/27/2013   Breast cancer ([place]) 2009   triple negative left breast   Breast cancer ([place]) 11/28/2007   Left breast stage T3N1, triple negative   Depression    GERD (gastroesophageal reflux disease)    watches what she eats   Headache(784.0)    thinks due to prozac   Hypertension    during pregnancy 2002   Neuromuscular disorder ([place])    neuropathy legs started after chemo   Pneumonia    2010, post surgery   Pollen allergies    Sarcoidosis    Shortness of breath    Sleep apnea     Past Surgical History:  Procedure Laterality Date   ABDOMINAL HYSTERECTOMY     bilateral mastectomy Bilateral 2009   left sided breast cancer and prophylactic right   BREAST SURGERY     CESAREAN SECTION     x 2   COLONOSCOPY  2010   LUMBAR LAMINECTOMY  Right 04/18/2013   Procedure: MICRODISCECTOMY LUMBAR LAMINECTOMY;  Surgeon: Marybelle Killings, MD;  Location: [facility];  Service: Orthopedics;  Laterality: Right;  Right L4-5 Microdiscectomy   MASTECTOMY     RECONSTRUCTION BREAST W/ TRAM FLAP Bilateral 2011   Done at [facility]      There were no vitals filed for this visit.   Subjective Assessment - 06/28/21 1031     Subjective Pt. indicated feeling some stuff in legs to 7/10 at times.  Pt. rated overall improvement to 80% at this time.  Pt. indicated feeling like she was doing well c HEP.    Pertinent History Previous laminectomy/discectomy 04/2013, HTN, breast cancer survivor, obesity    Limitations Sitting;Reading;House hold activities;Lifting;Standing;Walking    Patient Stated Goals Be able to do more with less pain    Currently in Pain? Yes    Pain Score 7    at times   Pain Location Back    Pain Orientation Lower    Pain Descriptors / Indicators Shooting    Pain Radiating Towards Rt leg    Pain Onset More than a month ago    Aggravating Factors  nothing specific reported  Pain Relieving Factors HEP, acupuncture                [facility] PT Assessment - 06/28/21 0001       Assessment   Medical Diagnosis bilateral hip pain    Referring Provider (PT) Aundra Dubin PA-C      Observation/Other Assessments   Focus on Therapeutic Outcomes (FOTO)  69% update      ROM / Strength   AROM / PROM / Strength Strength      AROM   Right Hip Flexion 90   in supine   Left Hip Flexion 110   in supine     Strength   Strength Assessment Site Hip    Right/Left Hip Right;Left    Right Hip Flexion 5/5    Right Hip Extension 5/5    Right Hip ABduction 4/5    Left Hip Flexion 5/5    Left Hip Extension 5/5    Left Hip ABduction 5/5      Flexibility   Hamstrings passive SLR Lt 90 degrees, Rt 55 degrees c pain.  Supine hamstring 90/90 lacking 15 degrees on Rt                           [facility] Adult PT  Treatment/Exercise - 06/28/21 0001       Lumbar Exercises: Stretches   Other Lumbar Stretch Exercise supine sciatic nerve flossing Rt 3 x 10      Lumbar Exercises: Aerobic   Recumbent Bike Lvl 4 10 mins                     PT Education - 06/28/21 1033     Education Details HEP review    Person(s) Educated Patient    Methods Explanation;Demonstration;Verbal cues    Comprehension Verbalized understanding;Returned demonstration              PT Short Term Goals - 05/26/21 1653       PT SHORT TERM GOAL #1   Title Tawnie will be independent with her starter HEP.    Status Achieved      PT SHORT TERM GOAL #2   Title Kabao will report infrequent B leg pain below the level of the gluteal folds.    Baseline Can be distal to the knees.    Status Achieved               PT Long Term Goals - 06/28/21 1038       PT LONG TERM GOAL #1   Title Improve FOTO to 61 in 14-24 visits.    Time 8    Period Weeks    Status Achieved      PT LONG TERM GOAL #2   Title Improve trunk extension AROM to 20 degrees.    Status Achieved      PT LONG TERM GOAL #3   Title Improve bilateral hip flexibility for hip flexors to 100 and hamstrings to 45 degrees.    Baseline --    Time 8    Period Weeks    Status Partially Met    Target Date 08/23/21      PT LONG TERM GOAL #4   Title Improve spine strength as assessed by FOTO and subjective self-report.    Time 8    Period Weeks    Status Achieved      PT LONG TERM GOAL #5   Title Improve low back  and B hip pain to consistently 0-4/10 on the Numeric Pain Rating Scale.    Time 8    Period Weeks    Status Partially Met    Target Date 08/23/21                   Plan - 06/28/21 1033     Clinical Impression Statement Pt. has reported 80% overall improvement to this point.  See objective data for updated information regarding current presentation.  Pt. has reported functional improvement as noted in FOTO score  improvements during treatment cycle.  Pt. continued to present c symptoms and mild mobility/strength deficits that can continue to improve c use of HEP and continue progression.  Pt. may benefit from additional [place] certification for additional visits to help continue progression and improve symptoms as well as improve HEP.    Personal Factors and Comorbidities Time since onset of injury/illness/exacerbation;Comorbidity 3+;Fitness    Comorbidities Previous laminectomy/discectomy in 2014, HTN, breast cancer survivor    Examination-Activity Limitations Sit;Sleep;Bend;Lift;Squat;Locomotion Level;Stairs;Carry    Examination-Participation Restrictions Interpersonal Relationship;Occupation;Volunteer;Community Activity    Stability/Clinical Decision Making Stable/Uncomplicated    Rehab Potential Good    PT Frequency 1x / week    PT Duration 8 weeks    PT Treatment/Interventions ADLs/Self Care Home Management;Moist Heat;Cryotherapy;Traction;Therapeutic activities;Therapeutic exercise;Neuromuscular re-education;Patient/family education;Manual techniques;Dry needling;Electrical Stimulation;Taping;Passive range of motion;Joint Manipulations;Gait training;Stair training;Functional mobility training    PT Next Visit Plan Sciatic nerve flossing review, hip abduction strength review.    PT Home Exercise Plan [id number]    Consulted and Agree with Plan of Care Patient             Patient will benefit from skilled therapeutic intervention in order to improve the following deficits and impairments:  Decreased activity tolerance, Decreased endurance, Decreased range of motion, Decreased strength, Difficulty walking, Impaired flexibility, Increased muscle spasms, Postural dysfunction, Improper body mechanics, Obesity, Pain  Visit Diagnosis: Chronic bilateral low back pain with bilateral sciatica - Plan: PT plan of care cert/re-cert  Abnormal posture - Plan: PT plan of care cert/re-cert  Muscle weakness  (generalized) - Plan: PT plan of care cert/re-cert  Difficulty walking - Plan: PT plan of care cert/re-cert     Problem List Patient Active Problem List   Diagnosis Date Noted   Hypertension 02/04/2021   Morbid obesity ([place]) 02/04/2021   OSA (obstructive sleep apnea) 02/04/2021   NAFLD (nonalcoholic fatty liver disease) 02/04/2021   Sarcoidosis 10/25/2020   Status post lumbar microdiscectomy 11/15/2018   Impingement syndrome of right shoulder 05/21/2018   Degenerative lumbar disc 05/07/2018   HNP (herniated nucleus pulposus), lumbar 04/18/2013    Class: Diagnosis of   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 M25.551 Treatment diagnosis? (if different than referring diagnosis) M54.41 What was this (referring dx) caused by? '[]'  Surgery '[]'  Fall '[x]'  Ongoing issue '[]'  Arthritis '[]'  Other: ____________  Laterality: '[]'  Rt '[]'  Lt '[x]'  Both  Check all possible CPT codes:      '[x]'  97110 (Therapeutic Exercise)  '[]'  38182 (SLP Treatment)  '[x]'  97112 (Neuro Re-ed)   '[]'  92526 (Swallowing Treatment)   '[x]'  97116 (Gait Training)   '[]'  [id number] (Cognitive Training, 1st 15 minutes) '[x]'  97140 (Manual Therapy)   '[]'  97130 (Cognitive Training, each add'l 15 minutes)  '[x]'  97530 (Therapeutic Activities)  '[]'  Other, List CPT Code ____________    '[x]'  99371 (Self Care)       [  x] All codes above (97110 - 97535)  '[]'  97012 (Mechanical Traction)  '[]'  97014 (E-stim Unattended)  '[x]'  97032 (E-stim manual)  '[]'  97033 (Ionto)  '[]'  97035 (Ultrasound)  '[]'  97760 (Orthotic Fit) '[x]'  [id number] (Physical Performance Training) '[]'  [id number] (Aquatic Therapy) '[]'  97034 (Contrast Bath) '[]'  [id number] (Paraffin) '[]'  97597 (Wound Care 1st 20 sq cm) '[]'  97598 (Wound Care each add'l 20 sq cm) '[]'  97016 (Vasopneumatic Device) '[]'  [phone] Comptroller) '[]'  [phone] (Prosthetic Training)   Scot Jun, PT, DPT, OCS, ATC 06/28/21  11:09  AM  PHYSICAL THERAPY DISCHARGE SUMMARY  Visits from Start of Care: 6  Current functional level related to goals / functional outcomes: See note   Remaining deficits: See note   Education / Equipment: HEP   Patient agrees to discharge. Patient goals were partially met. Patient is being discharged due to not returning since the last visit.  Scot Jun, PT, DPT, OCS, ATC 08/23/21  10:49 AM     [facility] [address] [place], [place], [address] Phone: [phone]   Fax:  [phone]  Name: KRISHA BEEGLE MRN: [id number] Date of Birth: 07-07-1971

## 2021-07-19 ENCOUNTER — Encounter: Payer: Federal, State, Local not specified - PPO | Admitting: Rehabilitative and Restorative Service Providers"

## 2021-07-19 ENCOUNTER — Telehealth: Payer: Self-pay | Admitting: Rehabilitative and Restorative Service Providers"

## 2021-07-19 NOTE — Telephone Encounter (Signed)
Called and left message after 20 mins no show for today's appointment.     No more additional visits scheduled at this time.  Scot Jun, PT, DPT, OCS, ATC 07/19/21  3:35 PM

## 2021-08-17 ENCOUNTER — Encounter: Payer: Self-pay | Admitting: Orthopaedic Surgery

## 2021-08-18 ENCOUNTER — Encounter: Payer: Self-pay | Admitting: Orthopaedic Surgery

## 2021-08-19 NOTE — Telephone Encounter (Signed)
Duplicate

## 2021-08-23 ENCOUNTER — Ambulatory Visit (INDEPENDENT_AMBULATORY_CARE_PROVIDER_SITE_OTHER): Payer: Federal, State, Local not specified - PPO | Admitting: Orthopaedic Surgery

## 2021-08-23 ENCOUNTER — Other Ambulatory Visit: Payer: Self-pay

## 2021-08-23 ENCOUNTER — Encounter: Payer: Self-pay | Admitting: Orthopaedic Surgery

## 2021-08-23 VITALS — BP 144/84 | Ht 67.5 in | Wt 238.0 lb

## 2021-08-23 DIAGNOSIS — M461 Sacroiliitis, not elsewhere classified: Secondary | ICD-10-CM

## 2021-08-23 DIAGNOSIS — Z9889 Other specified postprocedural states: Secondary | ICD-10-CM | POA: Diagnosis not present

## 2021-08-23 NOTE — Progress Notes (Signed)
Office Visit Note   Patient: Cynthia Saunders           Date of Birth: [birth year]           MRN: [id number] Visit Date: 08/23/2021              Requested by: Rayburn Felt, MD No address on file PCP: Rayburn Felt, MD   Assessment & Plan: Visit Diagnoses:  1. Sacroiliitis, not elsewhere classified ([place])   2. Status post lumbar microdiscectomy     Plan: ` Left SI joint injection performed hopefully this will help her pain.  Note given requesting position with some sitting some standing while she is working.  Patient asked about repeating EMGs nerve conduction velocities and I discussed that I do not think that would add anything diagnostically.  We reviewed images of her MRI scan which showed some mild foraminal narrowing and some mild facet degeneration at L4-5 as well as the disc desiccation at both L4-5 and L5-S1.  She admits to likely lifting her grandchild also going up and down steps may have aggravated her back symptoms.  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  Hopefully should get relief with the injection return as needed.  Follow-Up Instructions: Return if symptoms worsen or fail to improve.   Orders:  Orders Placed This Encounter  Procedures   Large Joint Inj   No orders of the defined types were placed in this encounter.     Procedures: Large Joint Inj on 08/23/2021 10:27 AM Details: 22 G needle     Clinical Data: No additional findings.   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  Prior to that she was moving around more and states she is having increasing back pain after prolonged sitting.  Her job does not avoid kneeling or heavy lifting.  She states that she had requested sit to stand-so that she can change positions due to back pain  that she has not related to her previous back surgery years ago.  Patient visited family in [place] for Thanksgiving.  She states she had increased pain walking up and down the stairs and also when she lifted her granddaughter who is age 78.  She is at increased pain in her back since that time.  She is also requesting note for sit/stand work position at [employer] that she works for.  Patient points directly over the left sacroiliac joint and is requesting an injection in that region to get improvement.  She has been going to due to the weight loss clinic and they have discussed gastric bypass.  Last MRI 04/08/2021 showed prior right hemilaminotomy microdiscectomy.  Some disc desiccation at L4-5 and L5-S1 with mild subarticular stenosis at L4-5 and bilateral neural foraminal narrowing.  No stenosis at L5-S1.  Review of Systems 14 point system unchanged as pertains HPI.   Objective: Vital Signs: BP (!) 144/84   Ht 5' 7.5" (1.715 m)   Wt 238 lb (108 kg)   BMI 36.73 kg/m   Physical Exam Constitutional:      Appearance: She is well-developed.  HENT:     Head: Normocephalic.     Right Ear: External ear normal.     Left Ear: External ear  normal. There is no impacted cerumen.  Eyes:     Pupils: Pupils are equal, round, and reactive to light.  Neck:     Thyroid: No thyromegaly.     Trachea: No tracheal deviation.  Cardiovascular:     Rate and Rhythm: Normal rate.  Pulmonary:     Effort: Pulmonary effort is normal.  Abdominal:     Palpations: Abdomen is soft.  Musculoskeletal:     Cervical back: No rigidity.  Skin:    General: Skin is warm and dry.  Neurological:     Mental Status: She is alert and oriented to person, place, and time.  Psychiatric:        Behavior: Behavior normal.    Ortho Exam negative straight leg raising 90 degrees anterior tib EHL is intact negative logroll of the hips.  Exquisite tenderness of the left sacroiliac joint.  Minimal tenderness over the  trochanters negative logroll the hips.  She is able to heel and toe walk but complains of pain in her lower back with both.  Specialty Comments:  No specialty comments available.  Imaging: No results found.   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 01/27/2013   Breast cancer ([place]) 11/28/2007   Past Medical History:  Diagnosis Date   Anxiety    Arthritis    Asthma    BRCA1 genetic carrier 01/27/2013   Breast cancer ([place]) 2009   triple negative left breast   Breast cancer ([place]) 11/28/2007   Left breast stage T3N1, triple negative   Depression    GERD (gastroesophageal reflux disease)    watches what she eats   Headache(784.0)    thinks due to prozac   Hypertension    during pregnancy 2002   Neuromuscular disorder ([place])    neuropathy legs started after chemo   Pneumonia    2010, post surgery   Pollen allergies    Sarcoidosis    Shortness of breath    Sleep apnea     Family History  Problem Relation Age of Onset   Diabetes Mother    Hypertension Mother    Hypertension Father    Diabetes Father    Hypertension Sister    Cancer Paternal Aunt 55       breast cancer died at 54   Cancer Paternal Uncle        lung cancer - 3 uncles   Diabetes Paternal Grandmother    Cancer Paternal Grandfather        lung cancer   Cancer Cousin 79       breast cancer deceased at 59    Past Surgical History:  Procedure Laterality Date   ABDOMINAL HYSTERECTOMY     bilateral mastectomy Bilateral 2009   left sided breast cancer and  prophylactic right   BREAST SURGERY     CESAREAN SECTION     x 2   COLONOSCOPY  2010   LUMBAR LAMINECTOMY Right 04/18/2013   Procedure: MICRODISCECTOMY LUMBAR LAMINECTOMY;  Surgeon: Marybelle Killings, MD;  Location: [facility];  Service: Orthopedics;  Laterality: Right;  Right L4-5 Microdiscectomy   MASTECTOMY     RECONSTRUCTION BREAST W/ TRAM FLAP Bilateral 2011   Done at [facility] History   Occupational History   Not on file  Tobacco Use   Smoking status: Never   Smokeless tobacco: Never  Substance and Sexual Activity   Alcohol use: Yes    Alcohol/week: 1.0 standard drink    Types: 1 Glasses of wine per week    Comment: at holidays   Drug use: No   Sexual activity: Yes    Birth control/protection: Condom

## 2021-08-28 IMAGING — CT CT ABD-PELV W/ CM
1 of 3 series · 13 of 32 positions shown, 19 images · IV contrast (APPLIED)
Comparison: 08/07/2018

CLINICAL DATA: Left lower quadrant pain

EXAM:
CT ABDOMEN AND PELVIS WITH CONTRAST
TECHNIQUE: Multidetector CT imaging of the abdomen and pelvis was performed
using the standard protocol following bolus administration of
intravenous contrast.
CONTRAST:  100mL 4Q7UU2-GTT IOPAMIDOL (4Q7UU2-GTT) INJECTION 61%

[Series 2: abd/pelvis w/cm · axial · 0.90mm/px · z∈[-459,-69]mm · 13 of 92 slices shown, 19 images]
[im 7/92  soft-tissue]
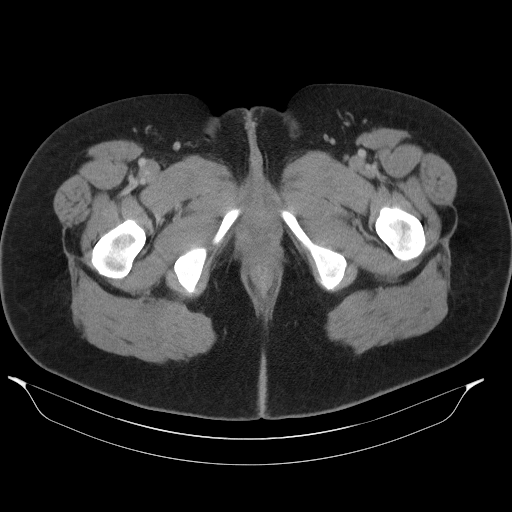
[im 7/92  bone]
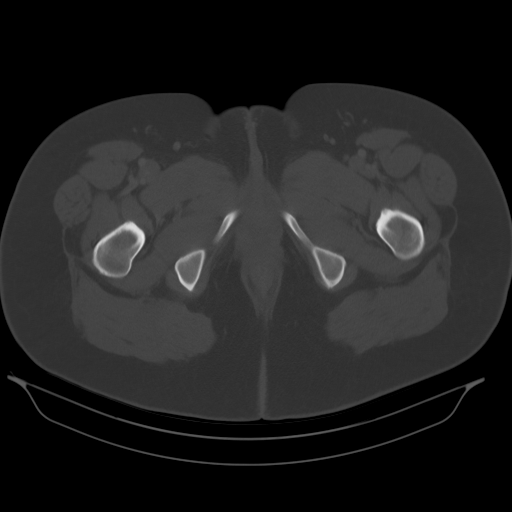
[im 13/92  soft-tissue]
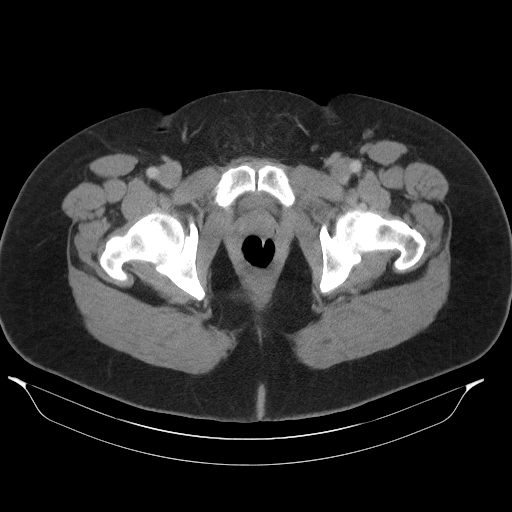
[im 19/92  soft-tissue]
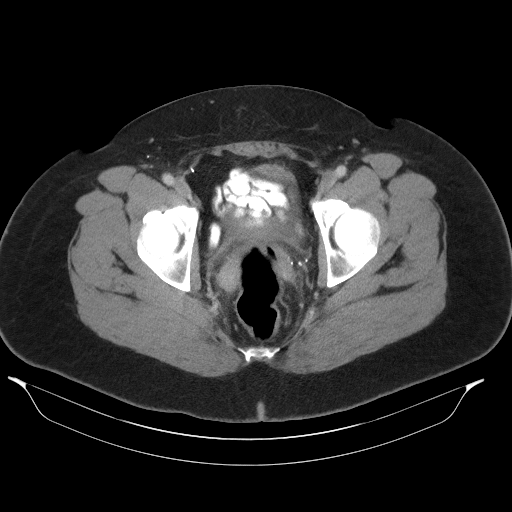
[im 25/92  soft-tissue]
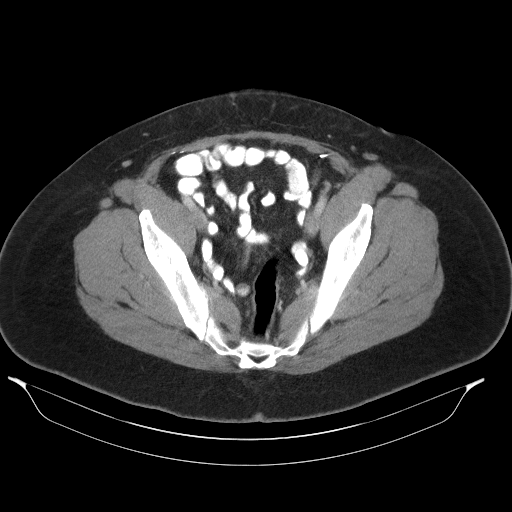
[im 31/92  soft-tissue]
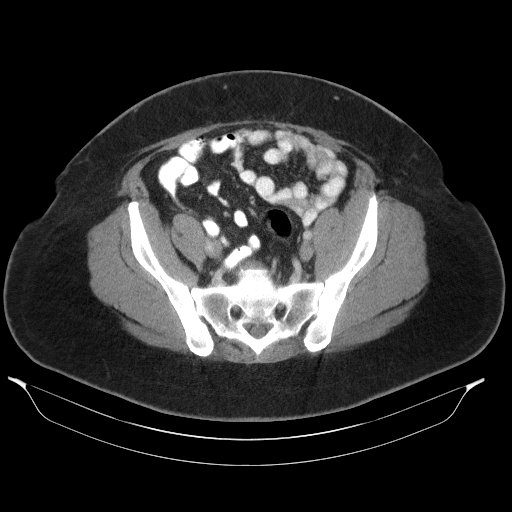
[im 37/92  soft-tissue]
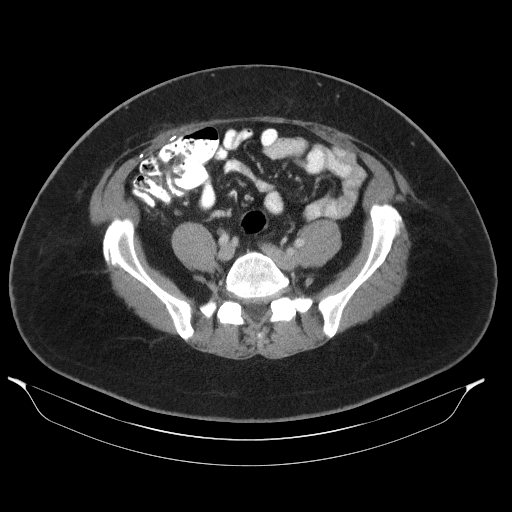
[im 49/92  soft-tissue]
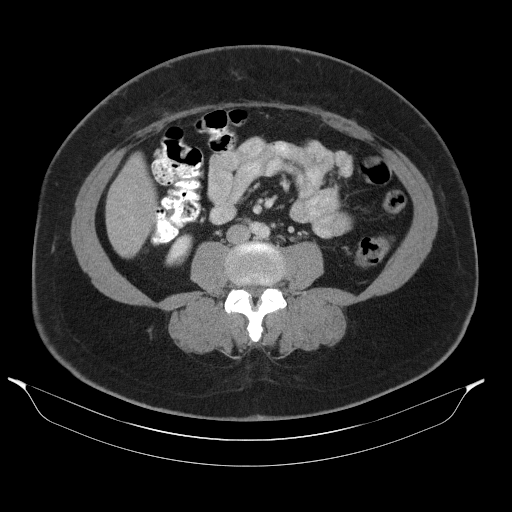
[im 55/92  soft-tissue]
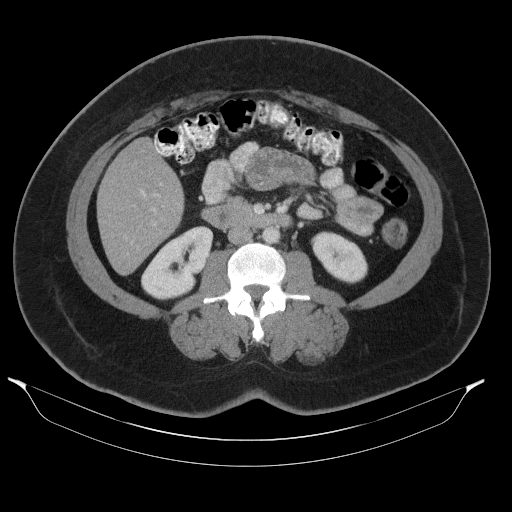
[im 61/92  soft-tissue]
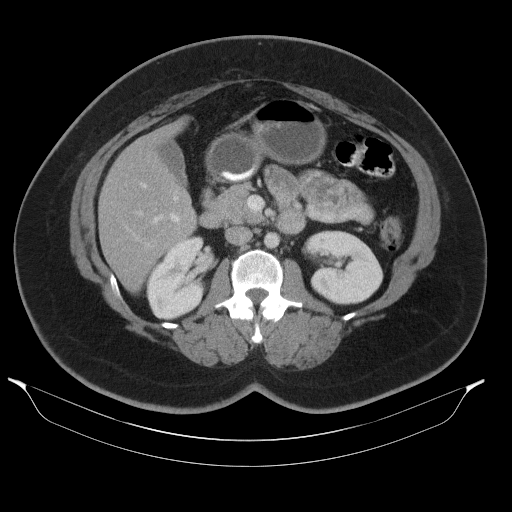
[im 61/92  bone]
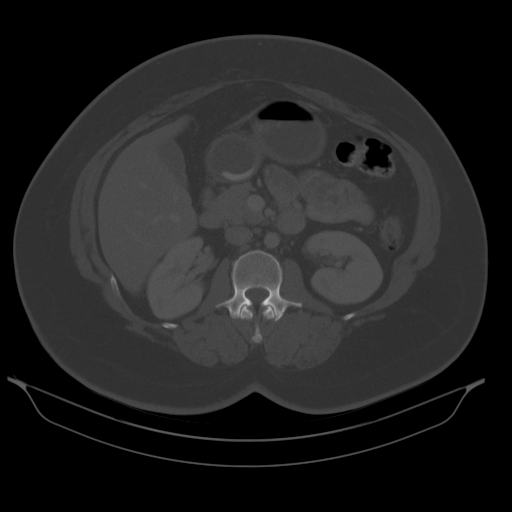
[im 67/92  soft-tissue]
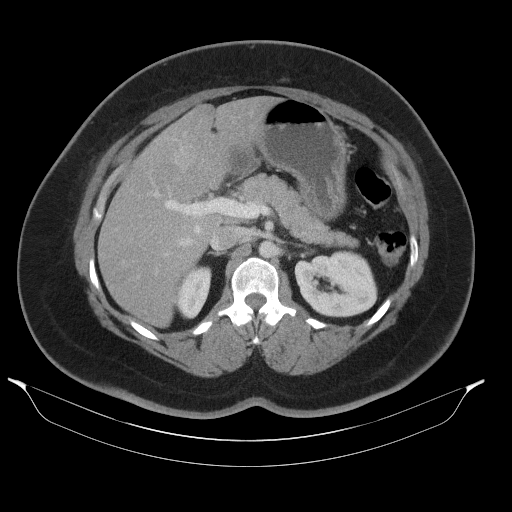
[im 67/92  lung]
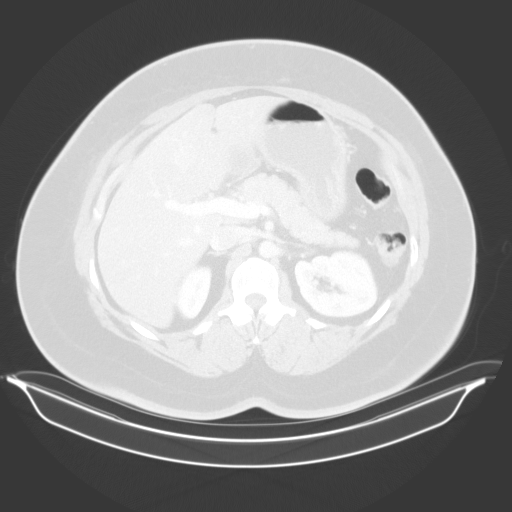
[im 73/92  soft-tissue]
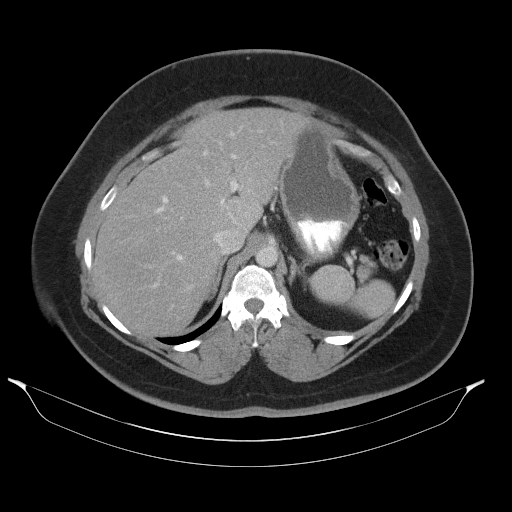
[im 73/92  lung]
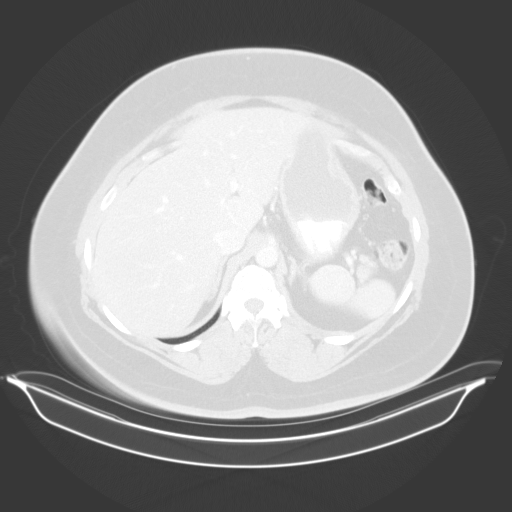
[im 79/92  soft-tissue]
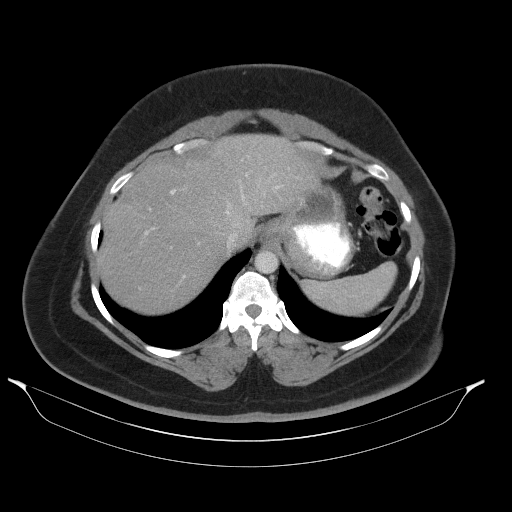
[im 79/92  lung]
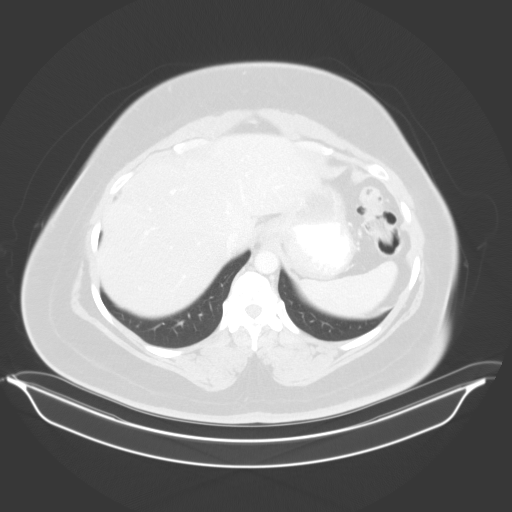
[im 85/92  soft-tissue]
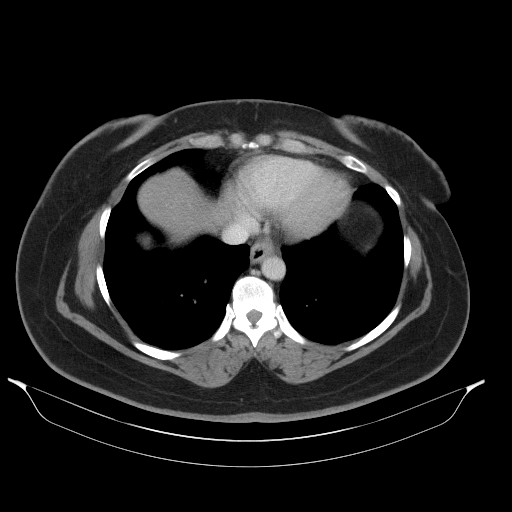
[im 85/92  lung]
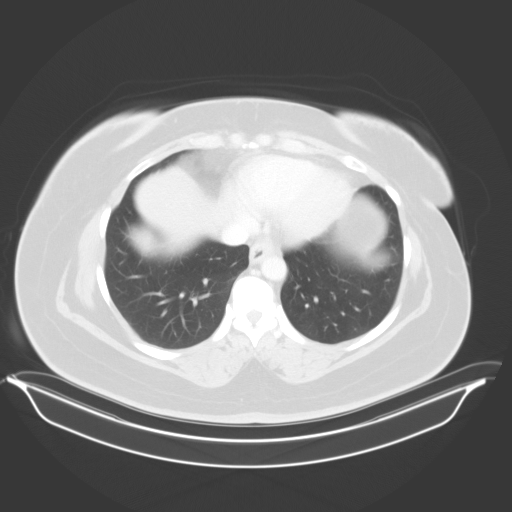

[13 of 32 positions shown; findings below may reference images not displayed]

FINDINGS: Lower chest: Lung bases are clear. No effusions. Heart is normal
size.

Hepatobiliary: Diffuse low-density throughout the liver compatible
with fatty infiltration. No focal abnormality. Gallbladder
unremarkable.

Pancreas: No focal abnormality or ductal dilatation.

Spleen: No focal abnormality.  Normal size.

Adrenals/Urinary Tract: 4 mm nonobstructing left lower pole stone.
No hydronephrosis. No ureteral stones. Adrenal glands and urinary
bladder unremarkable.

Stomach/Bowel: Normal appendix. Stomach, large and small bowel
grossly unremarkable.

Vascular/Lymphatic: No evidence of aneurysm or adenopathy.

Reproductive: Prior hysterectomy.  No adnexal masses.

Other: No free fluid or free air.

Musculoskeletal: No acute bony abnormality.
IMPRESSION: Hepatic steatosis.

Left lower pole nephrolithiasis.  No hydronephrosis.

## 2021-11-19 ENCOUNTER — Encounter: Payer: Self-pay | Admitting: Internal Medicine

## 2021-11-21 NOTE — Telephone Encounter (Signed)
Left a message to call back.

## 2021-11-23 ENCOUNTER — Telehealth: Payer: Self-pay | Admitting: Internal Medicine

## 2021-11-23 ENCOUNTER — Other Ambulatory Visit: Payer: Self-pay | Admitting: *Deleted

## 2021-11-23 NOTE — Telephone Encounter (Signed)
Attempted to call pt.  Phone rang one time and call disconnected.  Attempted again and received busy tone.  ?

## 2021-11-23 NOTE — Telephone Encounter (Signed)
Pt having intermittent CP and left arm pain for a few days.  Last episode was 2 days ago.  Usually lasts about 30 mins.  She lays down to rest when this is happening.  Denies SOB, sweating, vomiting or jaw pain.  Does have nausea and upper abdominal pain during episodes. Has not taken vitals.  States these symptoms started after the [place] doctor started her on Prazosin and Duloxetine last month.  Symptoms will wake her up out of her sleep.  Recalls that a couple of times when she was awakened she had been dreaming.  Does have PTSD.  Denies any symptoms currently.  Pt concerned due to her family history.  Father recently had stents and had an aunt pass away from MI.  Advised I will send message to Dr. Gasper Sells and someone from our office would be in contact as soon as we heard from him.  Pt appreciative for call.  ? ?This is the MyChart message pt sent over on 11/19/21: ? ?Hello Dr. Wendi Maya, my panic attacks have increased and my cholesterol levels are higher. I have had frequent pains in my left arm and chest. I'm worried. Should I make an appointment? ?

## 2021-11-23 NOTE — Telephone Encounter (Signed)
Pt returning phone call to [facility]... please advise  ?

## 2021-11-24 MED ORDER — PROPRANOLOL HCL 40 MG PO TABS: 40.0000 mg | ORAL_TABLET | Freq: Two times a day (BID) | ORAL | 11 refills | Status: AC

## 2021-11-24 NOTE — Telephone Encounter (Signed)
Called pt reviewed MD recommendations.  Pt request that medication be sent to [place] in [place].  Pt then questioned if med should be taken with the other medications prescribed.  Advised pt that BP monitor would be helpful to ensure BP does not drop to a low level.  Pt states " you can prescribe medication but I'm not going to take it until either the MD or my PCP tells me it is okay'.  Advised pt will send concern to MD to address.  ?

## 2021-11-25 ENCOUNTER — Encounter: Payer: Self-pay | Admitting: Internal Medicine

## 2021-11-25 NOTE — Telephone Encounter (Signed)
Called pt to review MD recommendations.  Pt reports MD at [facility] is questioning why pt is on propranolol 40 mg.  Pt sent in my chart message just before my phone call.  Will address my chart message.  ?

## 2022-01-27 ENCOUNTER — Telehealth: Payer: Self-pay | Admitting: Orthopaedic Surgery

## 2022-01-27 NOTE — Telephone Encounter (Signed)
Pt called to set an old injury appt with Dr. Lorin Mercy of [facility] for back pains. Injury was 08/04/2010. Did not set appt due to [facility] elapsed and informed pt Judie Petit will call back to set appt. Please call pt at [phone]. ?

## 2022-01-30 ENCOUNTER — Telehealth: Payer: Self-pay | Admitting: Radiology

## 2022-01-30 NOTE — Telephone Encounter (Signed)
Rx given to you. Please let me know if you need for me to do anything else. ?

## 2022-01-30 NOTE — Telephone Encounter (Signed)
noted 

## 2022-01-30 NOTE — Telephone Encounter (Signed)
Please see below and advise. OK for note? ? ?Ms. Balsam called requesting another update referral regarding her needing to have a stand up desk to work at. Said that Dr. Lorin Mercy hand written this order back in 2014 and now her employer is asking for a new RX. ?

## 2022-01-30 NOTE — Telephone Encounter (Signed)
Note entered. ? ?Cynthia Saunders-do you need for me to call patient or do something with note? Just let me know. ?Thanks. ?

## 2022-02-07 ENCOUNTER — Encounter: Payer: Self-pay | Admitting: Orthopaedic Surgery

## 2022-02-07 ENCOUNTER — Ambulatory Visit (INDEPENDENT_AMBULATORY_CARE_PROVIDER_SITE_OTHER): Payer: Federal, State, Local not specified - PPO | Admitting: Orthopaedic Surgery

## 2022-02-07 ENCOUNTER — Telehealth: Payer: Self-pay | Admitting: Orthopaedic Surgery

## 2022-02-07 VITALS — BP 147/89 | HR 82 | Ht 67.5 in | Wt 242.0 lb

## 2022-02-07 DIAGNOSIS — Z9889 Other specified postprocedural states: Secondary | ICD-10-CM

## 2022-02-07 DIAGNOSIS — M5136 Other intervertebral disc degeneration, lumbar region: Secondary | ICD-10-CM | POA: Diagnosis not present

## 2022-02-07 NOTE — Telephone Encounter (Signed)
Patient would to pick up the letter for the stand up desk and she will be down stair in the lobby.  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

## 2022-02-07 NOTE — Progress Notes (Unsigned)
Office Visit Note   Patient: Cynthia Saunders           Date of Birth: 05/05/1971           MRN: [id number] Visit Date: 02/07/2022              Requested by: Rayburn Felt, MD No address on file PCP: Rayburn Felt, MD   Assessment & Plan: Visit Diagnoses:  1. Status post lumbar microdiscectomy   2. Degenerative lumbar disc     Plan: Patient got good relief of her back pain when she is on Cymbalta 90 mg daily.  We discussed improvements in pain with certain types of antidepressant medication.  She might do well with low-dose Cymbalta such as 30 mg in combination with another antidepressant medication.  I explained the patient not refused to see ear and I have seen her for more than 10 years intermittently.  She does have some disc degeneration at L4-5 and L5-S1 1 and has had some intermittent radicular symptoms has pain with certain activities such as lifting bending.  She can talk with her prescriber at the [facility] about her medications.  She states she has not started the Prozac at this point.  No additional studies or scans indicated at this point.  Again we discussed options with disc degeneration.  Again I discussed I do not recommend lumbar fusion for the 2 levels that she has disc degeneration present.  I explained her that I coded her visit appropriately based on her findings and that is not unusual for people with back problems have radiating pain toward her sacroiliac joint and injection for this was appropriate for her treatment.  Follow-Up Instructions: Return if symptoms worsen or fail to improve.   Orders:  No orders of the defined types were placed in this encounter.  No orders of the defined types were placed in this encounter.     Procedures: No procedures performed   Clinical Data: No additional findings.   Subjective: Chief Complaint  Patient presents with   Lower Back - Pain    HPI 51 year old female returns she states she try to get appointment but was  denied making appointment since [employer] in October her last 3 visits.  She has 2 codes that she obtained from [employer] coder which she states will get her visits covered.  She had previous on-the-job injury with microdiscectomy L4-5 in 2014.  Patient had gotten relief when she was on Cymbalta 90 mg but states she had problems with hair loss and recently was started on Prozac.  She has been on Wellbutrin which caused some anger problems the amitriptyline she believes made her sleepy.  Zoloft made her have increased blood pressure.  She is getting ready to try Prozac.  She is getting ready to go on a job assignment with [employer] and will be in [place] helping to do her type activities with children.  She has not gotten relief with ibuprofen or Naprosyn.  Last visit was 08/23/2021.  Patient had increased pain over the sacroiliac joint.  MRI scan 04/08/2021 showed some disc desiccation at L4-5 and L5-S1 with mild subarticular narrowing at L4-5 and bilateral neuroforaminal narrowing at L5-S1.  Patient is having specific pain over left SI joint and injection was performed that gave her some relief at that visit.  In the interim from 2014 she had been in school with fair studies.  She has not been back to work for [employer].  Review of Systems no  chills or fever no bowel bladder associated symptoms.   Objective: Vital Signs: BP (!) 147/89   Pulse 82   Ht 5' 7.5" (1.715 m)   Wt 242 lb (109.8 kg)   BMI 37.34 kg/m   Physical Exam Constitutional:      Appearance: She is well-developed.  HENT:     Head: Normocephalic.     Right Ear: External ear normal.     Left Ear: External ear normal. There is no impacted cerumen.  Eyes:     Pupils: Pupils are equal, round, and reactive to light.  Neck:     Thyroid: No thyromegaly.     Trachea: No tracheal deviation.  Cardiovascular:     Rate and Rhythm: Normal rate.  Pulmonary:     Effort: Pulmonary effort is normal.  Abdominal:     Palpations: Abdomen  is soft.  Musculoskeletal:     Cervical back: No rigidity.  Skin:    General: Skin is warm and dry.  Neurological:     Mental Status: She is alert and oriented to person, place, and time.  Psychiatric:        Behavior: Behavior normal.    Ortho Exam patient has normal heel-toe gait.  No lower extremity atrophy.  Specialty Comments:  No specialty comments available.  Imaging: No results found.   PMFS History: Patient Active Problem List   Diagnosis Date Noted   Hypertension 02/04/2021   Morbid obesity ([place]) 02/04/2021   OSA (obstructive sleep apnea) 02/04/2021   NAFLD (nonalcoholic fatty liver disease) 02/04/2021   Sarcoidosis 10/25/2020   Status post lumbar microdiscectomy 11/15/2018   Impingement syndrome of right shoulder 05/21/2018   Degenerative lumbar disc 05/07/2018   HNP (herniated nucleus pulposus), lumbar 04/18/2013    Class: Diagnosis of   BRCA1 genetic carrier 01/27/2013   Breast cancer ([place]) 11/28/2007   Past Medical History:  Diagnosis Date   Anxiety    Arthritis    Asthma    BRCA1 genetic carrier 01/27/2013   Breast cancer ([place]) 2009   triple negative left breast   Breast cancer ([place]) 11/28/2007   Left breast stage T3N1, triple negative   Depression    GERD (gastroesophageal reflux disease)    watches what she eats   Headache(784.0)    thinks due to prozac   Hypertension    during pregnancy 2002   Neuromuscular disorder ([place])    neuropathy legs started after chemo   Pneumonia    2010, post surgery   Pollen allergies    Sarcoidosis    Shortness of breath    Sleep apnea     Family History  Problem Relation Age of Onset   Diabetes Mother    Hypertension Mother    Hypertension Father    Diabetes Father    Hypertension Sister    Cancer Paternal Aunt 61       breast cancer died at 59   Cancer Paternal Uncle        lung cancer - 3 uncles   Diabetes Paternal Grandmother    Cancer Paternal Grandfather        lung cancer   Cancer Cousin 21        breast cancer deceased at 44    Past Surgical History:  Procedure Laterality Date   ABDOMINAL HYSTERECTOMY     bilateral mastectomy Bilateral 2009   left sided breast cancer and prophylactic right   BREAST SURGERY     CESAREAN SECTION     x  2   COLONOSCOPY  2010   LUMBAR LAMINECTOMY Right 04/18/2013   Procedure: MICRODISCECTOMY LUMBAR LAMINECTOMY;  Surgeon: Marybelle Killings, MD;  Location: [facility];  Service: Orthopedics;  Laterality: Right;  Right L4-5 Microdiscectomy   MASTECTOMY     RECONSTRUCTION BREAST W/ TRAM FLAP Bilateral 2011   Done at [facility] History   Occupational History   Not on file  Tobacco Use   Smoking status: Never   Smokeless tobacco: Never  Substance and Sexual Activity   Alcohol use: Yes    Alcohol/week: 1.0 standard drink    Types: 1 Glasses of wine per week    Comment: at holidays   Drug use: No   Sexual activity: Yes    Birth control/protection: Condom

## 2022-07-19 ENCOUNTER — Telehealth: Payer: Self-pay | Admitting: Radiology

## 2022-07-19 NOTE — Telephone Encounter (Signed)
Patient left voicemail for [facility] requesting her rating. 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 We are no longer taking Department of Labor beginning 04/03/2022.  Can you advise on rating?
